# Patient Record
Sex: Male | Born: 1980 | Race: White | Hispanic: No | Marital: Married | Smoking: Never smoker
Health system: Southern US, Community
[De-identification: ages and names within clinical notes are randomized; demographics above are authoritative.]

## PROBLEM LIST (undated history)

## (undated) HISTORY — PX: OTHER SURGICAL HISTORY: SHX169

## (undated) HISTORY — PX: ARTHROSCOPY KNEE W/ DRILLING: SUR92

## (undated) HISTORY — DX: Morbid (severe) obesity due to excess calories: E66.01

## (undated) HISTORY — PX: WISDOM TOOTH EXTRACTION: SHX21

---

## 2008-11-23 ENCOUNTER — Encounter: Admission: RE | Admit: 2008-11-23 | Discharge: 2008-11-23 | Payer: Self-pay | Admitting: Internal Medicine

## 2009-12-23 IMAGING — CT CT ANGIO CHEST
4 of 9 series · 18 of 36 positions shown · IV contrast (CONTRAST)
Comparison: None

CLINICAL DATA: SHORTNESS OF BREATH, DECREASED OXYGENATION,
ASSESSMENT FOR PULMONARY EMBOLISM.

CT ANGIOGRAPHY CHEST WITH CONTRAST
TECHNIQUE: Multidetector CT imaging of the chest was performed
using the standard protocol during bolus administration of
intravenous contrast. Multiplanar CT image reconstructions
including MIPs were obtained to evaluate the vascular anatomy.
Contrast: Intravenous 150 ml [id number].

[Series 4: arterial 2x2 · axial · arterial · 0.90mm/px · z∈[+1322,+1522]mm · 5 of 121 slices shown]
[im 21/121  lung]
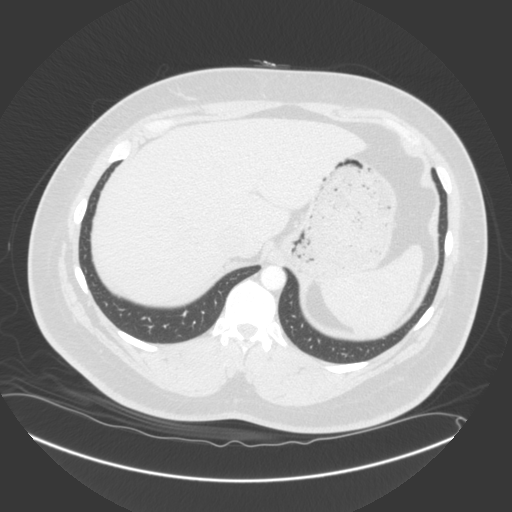
[im 41/121  mediastinal]
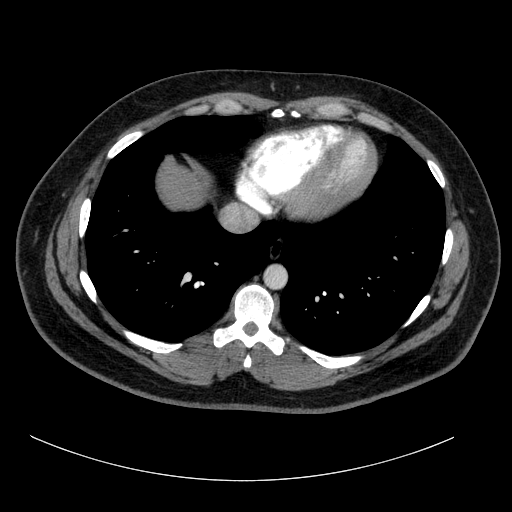
[im 61/121  lung]
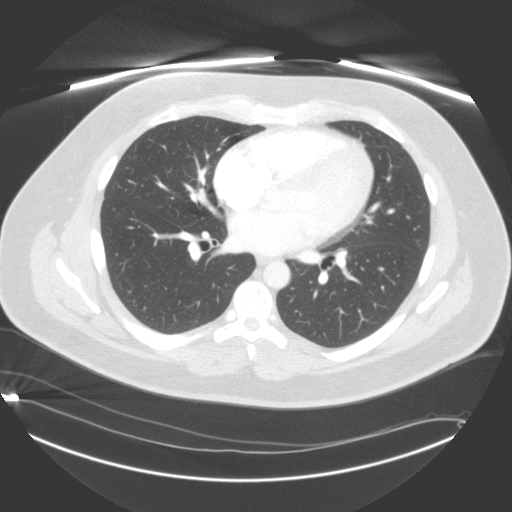
[im 81/121  mediastinal]
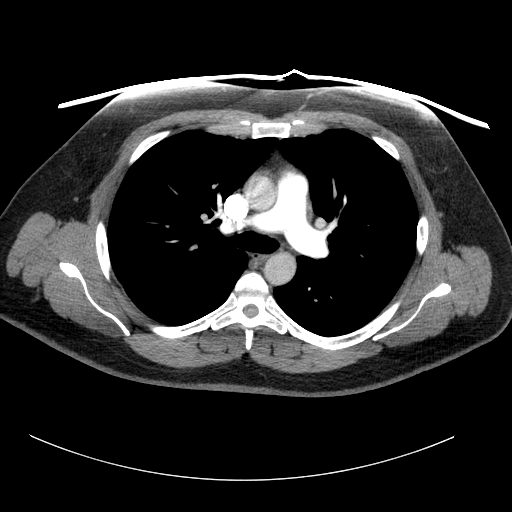
[im 101/121  lung]
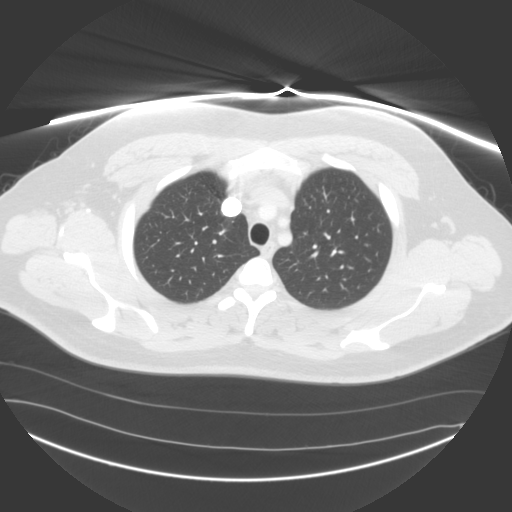

[Series 6: thins pacs · axial · 0.90mm/px · z∈[+1296,+1546]mm · 8 of 215 slices shown]
[im 18/215  lung]
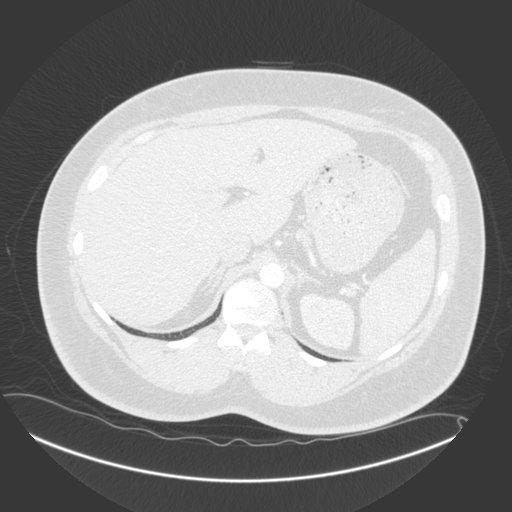
[im 54/215  lung]
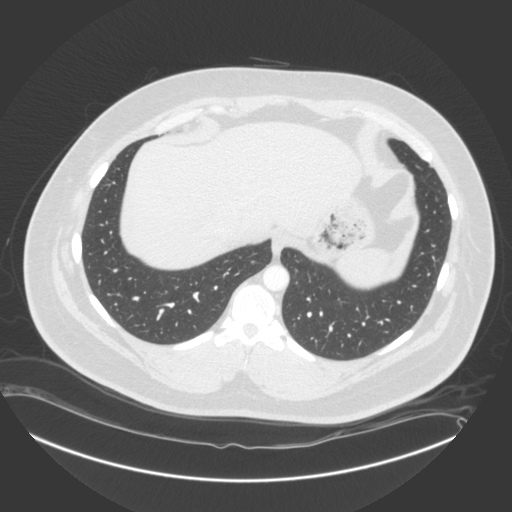
[im 72/215  lung]
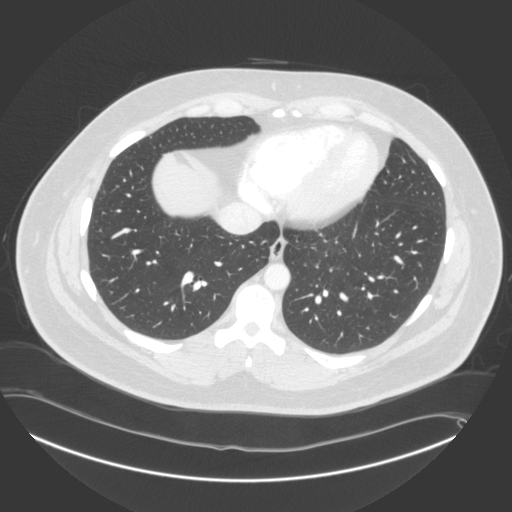
[im 90/215  lung]
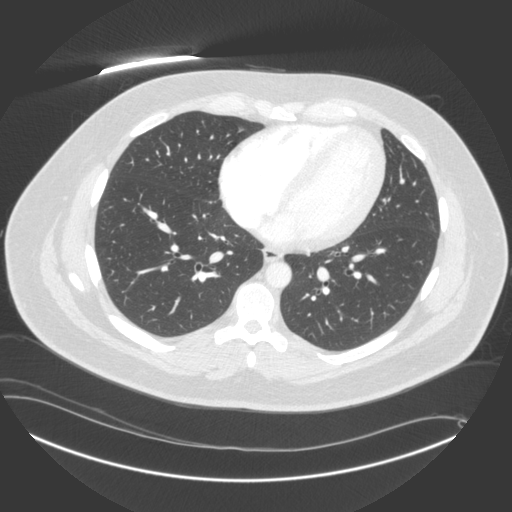
[im 125/215  lung]
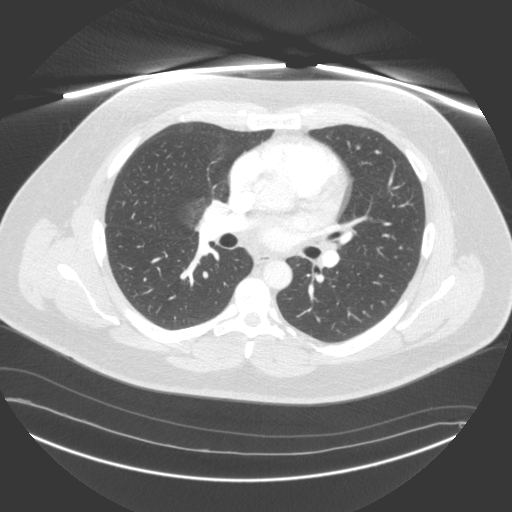
[im 143/215  lung]
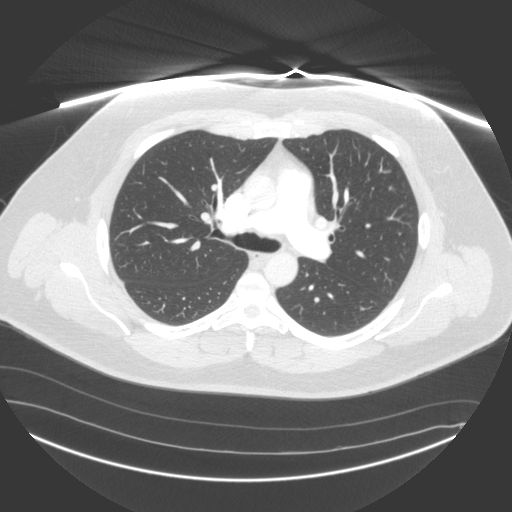
[im 161/215  lung]
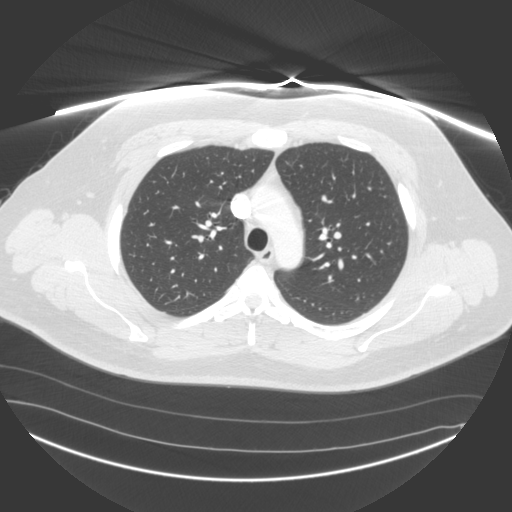
[im 197/215  lung]
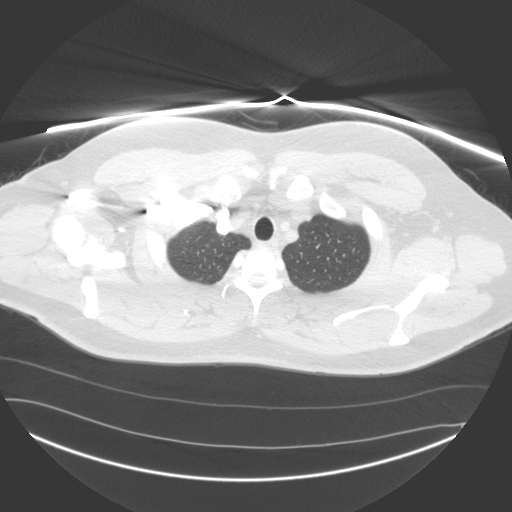

[Series 7: lung 3's · axial · 0.90mm/px · z∈[+1341,+1512]mm · 4 of 97 slices shown]
[im 20/97  mediastinal]
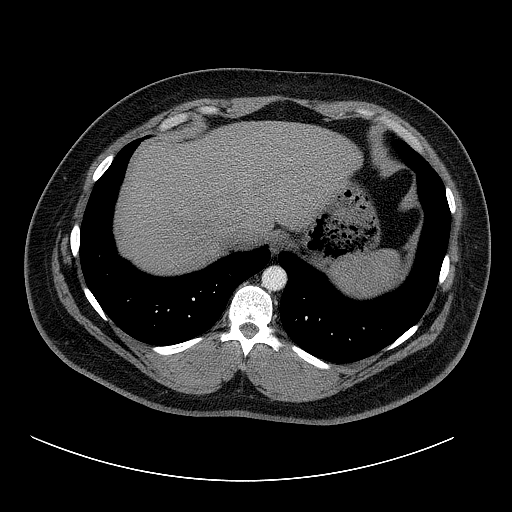
[im 39/97  mediastinal]
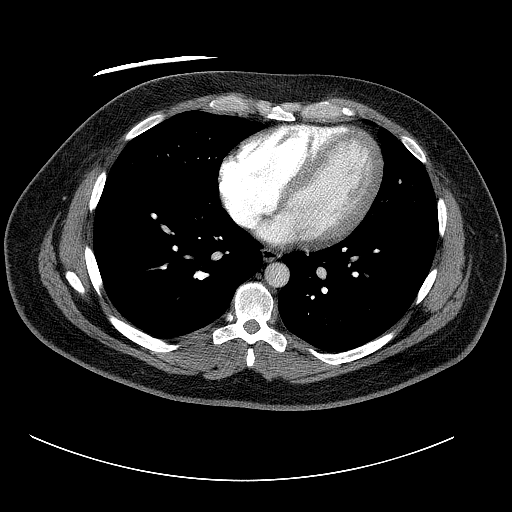
[im 58/97  mediastinal]
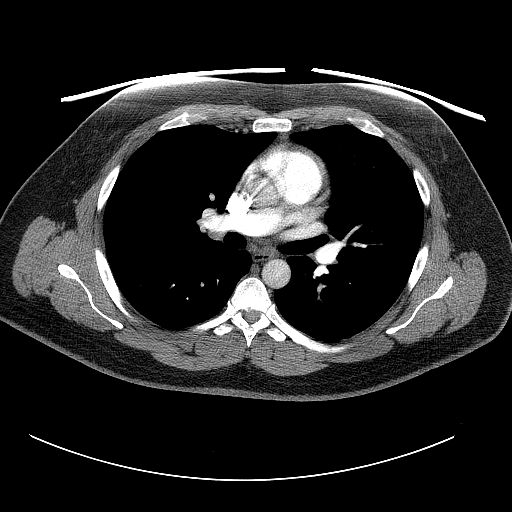
[im 77/97  mediastinal]
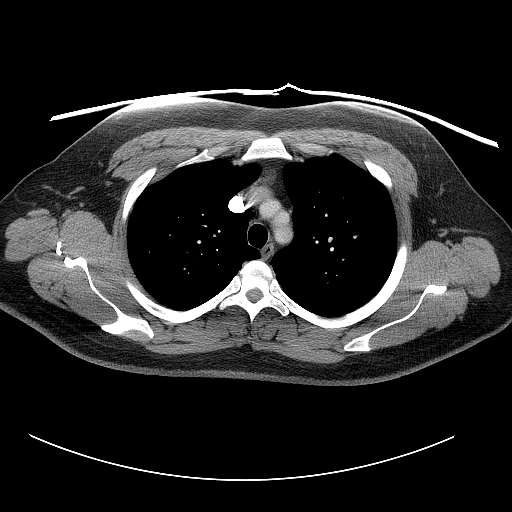

[Series 8054: coronals · coronal · 0.90mm/px · 1 of 106 slices shown]
[im 53/106  mediastinal]
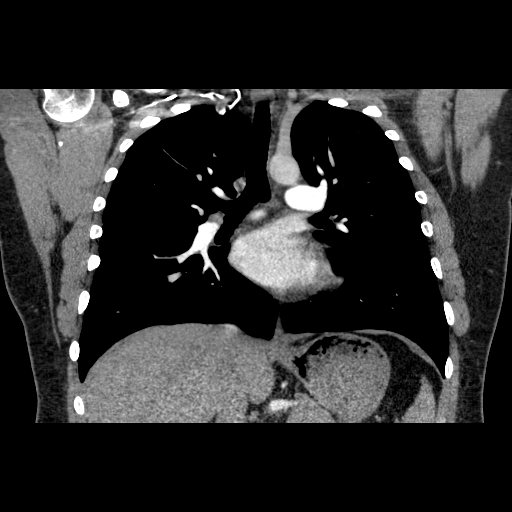

[18 of 36 positions shown; findings below may reference images not displayed]

FINDINGS: Lungs are clear.  No evidence for pulmonary embolus
seen.  Slight residual thymus tissue without focal mass is
consistent with the patient's age.  Heart size is normal.  No
mediastinal, hilar, axillary, supraclavicular mass/adenopathy is
seen.  No significant osseous lesions seen.  Upper abdominal organs
appear normal without inflammation, free fluid or adenopathy.

Review of the MIP images confirms the above findings.
IMPRESSION: 1.  No evidence for pulmonary embolus.
2.  Slight residual thymic tissue consistent with the patient's
age.
3.  Otherwise, negative.

## 2011-11-05 ENCOUNTER — Ambulatory Visit: Payer: Managed Care, Other (non HMO) | Admitting: Physician Assistant

## 2011-11-05 VITALS — BP 125/87 | HR 85 | Temp 98.7°F | Resp 16 | Ht 70.5 in | Wt 300.0 lb

## 2011-11-05 DIAGNOSIS — L255 Unspecified contact dermatitis due to plants, except food: Secondary | ICD-10-CM

## 2011-11-05 DIAGNOSIS — S8010XA Contusion of unspecified lower leg, initial encounter: Secondary | ICD-10-CM

## 2011-11-05 DIAGNOSIS — L0390 Cellulitis, unspecified: Secondary | ICD-10-CM

## 2011-11-05 DIAGNOSIS — L237 Allergic contact dermatitis due to plants, except food: Secondary | ICD-10-CM

## 2011-11-05 DIAGNOSIS — M79609 Pain in unspecified limb: Secondary | ICD-10-CM

## 2011-11-05 LAB — POCT CBC
HCT, POC: 39.7 % — AB (ref 43.5–53.7)
Hemoglobin: 14.7 g/dL (ref 14.1–18.1)
Lymph, poc: 2.2 (ref 0.6–3.4)
MCH, POC: 33.3 pg — AB (ref 27–31.2)
MCV: 90.1 fL (ref 80–97)
MPV: 8.7 fL (ref 0–99.8)
RBC: 4.41 M/uL — AB (ref 4.69–6.13)
WBC: 8.6 10*3/uL (ref 4.6–10.2)

## 2011-11-05 MED ORDER — METHYLPREDNISOLONE ACETATE 80 MG/ML IJ SUSP
80.0000 mg | Freq: Once | INTRAMUSCULAR | Status: AC
  Administered 2011-11-05: 80 mg via INTRAMUSCULAR

## 2011-11-05 MED ORDER — CEFTRIAXONE SODIUM 1 G IJ SOLR
1.0000 g | INTRAMUSCULAR | Status: DC
  Administered 2011-11-05: 1 g via INTRAMUSCULAR

## 2011-11-05 MED ORDER — PREDNISONE 20 MG PO TABS: ORAL_TABLET | ORAL | Status: AC

## 2011-11-05 MED ORDER — CEFTRIAXONE SODIUM 1 G IJ SOLR: 1.0000 g | Freq: Once | INTRAMUSCULAR | Status: DC

## 2011-11-05 MED ORDER — CEPHALEXIN 500 MG PO CAPS: 500.0000 mg | ORAL_CAPSULE | Freq: Three times a day (TID) | ORAL | Status: AC

## 2011-11-05 NOTE — Progress Notes (Signed)
Patient ID: Carlos Boyd MRN: [id number], DOB: [birth year], 31 y.o. Date of Encounter: 11/05/2011, 8:01 PM  Primary Physician: No primary provider on file.  Chief Complaint: Pruritic rash  HPI: 31 y.o. year old male with presents with a 7 day history of worseing erythematous pruritic rash along bilateral lower extremities. He states when he awoke this morning the erythema had slightly worsened causing his legs to feel tight. Multiple weeping lesions along lower legs. Still somewhat pruritic. Full range of motion and sensation along bilateral feet. Patient was doing yard work prior to the development of the rash. Known poison ivy in the vicinity. Did not wash all clothing or linens after the exposure. Has not yet washed the dog either. Lesions now weeping clear fluid. Has tried Benadryl, Motrin, and cortisone cream. Through out his illness he has remained afebrile and without chills. No nausea or vomiting. Patient is otherwise doing well without issues or complaints.  No past medical history on file.   Home Meds: Prior to Admission medications   Medication Sig Start Date End Date Taking? Authorizing Provider                  Allergies: No Known Allergies  History   Social History  . Marital Status: Unknown    Spouse Name: N/A    Number of Children: N/A  . Years of Education: N/A   Occupational History  . Not on file.   Social History Main Topics  . Smoking status: Never Smoker   . Smokeless tobacco: Not on file  . Alcohol Use: Not on file  . Drug Use: Not on file  . Sexually Active: Not on file   Other Topics Concern  . Not on file   Social History Narrative  . No narrative on file     Review of Systems: Constitutional: negative for chills, fever, night sweats, weight changes, or fatigue  HEENT: negative for vision changes, hearing loss, congestion, rhinorrhea, ST, epistaxis, or sinus pressure Cardiovascular: negative for chest pain or palpitations Respiratory:  negative for hemoptysis, wheezing, shortness of breath, or cough Abdominal: negative for abdominal pain, nausea, vomiting, diarrhea, or constipation Dermatological: see above Neurologic: negative for headache, dizziness, or syncope   Physical Exam: Blood pressure 125/87, pulse 85, temperature 98.7 F (37.1 C), resp. rate 16, height 5' 10.5" (1.791 m), weight 300 lb (136.079 kg)., Body mass index is 42.44 kg/(m^2). General: Well developed, well nourished, in no acute distress. Head: Normocephalic, atraumatic, eyes without discharge, sclera non-icteric, nares are without discharge.  Neck: Supple. No thyromegaly. Full ROM. No lymphadenopathy. Lungs: Breathing is unlabored. Heart: Regula rate. Msk:  Strength and tone normal for age. Extremities/Skin: See photo scanned in. Multiple vesicular weeping lesions along bilateral lower extremities, right greater than left, consistent with poison ivy. He does have a secondary cellulitis along the right leg. DP/PT pulses 2+ and equal bilaterally. Cap refill less than 2 seconds through all digits. No clubbing or cyanosis.  Neuro: Alert and oriented X 3. Moves all extremities spontaneously. Gait is normal. CNII-XII grossly in tact. Psych:  Responds to questions appropriately with a normal affect.   Labs: Results for orders placed in visit on 11/05/11  POCT CBC      Component Value Range   WBC 8.6  4.6 - 10.2 (K/uL)   Lymph, poc 2.2  0.6 - 3.4    POC LYMPH PERCENT 25.9  10 - 50 (%L)   MID (cbc) 0.7  0 - 0.9    POC  MID % 8.5  0 - 12 (%M)   POC Granulocyte 5.6  2 - 6.9    Granulocyte percent 65.6  37 - 80 (%G)   RBC 4.41 (*) 4.69 - 6.13 (M/uL)   Hemoglobin 14.7  14.1 - 18.1 (g/dL)   HCT, POC 91.4 (*) 78.2 - 53.7 (%)   MCV 90.1  80 - 97 (fL)   MCH, POC 33.3 (*) 27 - 31.2 (pg)   MCHC 37.0 (*) 31.8 - 35.4 (g/dL)   RDW, POC 95.6     Platelet Count, POC 292  142 - 424 (K/uL)   MPV 8.7  0 - 99.8 (fL)  GLUCOSE, POCT (MANUAL RESULT ENTRY)      Component  Value Range   POC Glucose 93       ASSESSMENT AND PLAN:  31 y.o. year old male with poison ivy bilateral extremities with cellulitis -Wound dressed with telfa and ACE wrap -Elevation -DepoMedrol 80 mg IM -Rocephin 1 gram IM -Prednisone 20 mg #18 3x3, 2x3, 1x3 no RF -Zyrtec -Zantac -Benadryl -Wash all contaminated clothes and linens -Recheck 3 days, sooner if worse   Signed, Eula Listen, PA-C 11/05/2011 8:01 PM

## 2011-11-08 ENCOUNTER — Ambulatory Visit: Payer: Managed Care, Other (non HMO) | Admitting: Family Medicine

## 2011-11-08 VITALS — BP 134/88 | HR 76 | Temp 98.6°F | Resp 18 | Ht 70.75 in | Wt 301.8 lb

## 2011-11-08 DIAGNOSIS — L0291 Cutaneous abscess, unspecified: Secondary | ICD-10-CM

## 2011-11-08 DIAGNOSIS — L237 Allergic contact dermatitis due to plants, except food: Secondary | ICD-10-CM

## 2011-11-08 DIAGNOSIS — L0390 Cellulitis, unspecified: Secondary | ICD-10-CM

## 2011-11-08 DIAGNOSIS — L255 Unspecified contact dermatitis due to plants, except food: Secondary | ICD-10-CM

## 2011-11-08 NOTE — Progress Notes (Signed)
[facility]:  Office Visit  Chief Complaint:  Chief Complaint  Patient presents with  . Follow-up    cellulitis right leg    HPI: Carlos Boyd is a 31 y.o. male who complains of  Recheck for posion ivy and cellulitis on bilateral Yader Criger . Much improved.   History reviewed. No pertinent past medical history. Past Surgical History  Procedure Date  . Right arthroscopy knee     [place], [place]   History   Social History  . Marital Status: Unknown    Spouse Name: N/A    Number of Children: N/A  . Years of Education: N/A   Social History Main Topics  . Smoking status: Never Smoker   . Smokeless tobacco: None  . Alcohol Use: Yes  . Drug Use: No  . Sexually Active: None   Other Topics Concern  . None   Social History Narrative  . None   No family history on file. No Known Allergies Prior to Admission medications   Medication Sig Start Date End Date Taking? Authorizing Provider  cephALEXin (KEFLEX) 500 MG capsule Take 1 capsule (500 mg total) by mouth 3 (three) times daily. 11/05/11 11/15/11 Yes Ryan M Dunn, PA-C  predniSONE (DELTASONE) 20 MG tablet 3 PO FOR 3 DAYS, 2 PO FOR 3 DAYS, 1 PO FOR 3 DAYS 11/05/11 11/15/11 Yes Ryan M Dunn, PA-C     ROS: The patient denies fevers, chills, night sweats, unintentional weight loss, chest pain, palpitations, wheezing, dyspnea on exertion, nausea, vomiting, abdominal pain, dysuria, hematuria, melena, numbness, weakness, or tingling. + rash  All other systems have been reviewed and were otherwise negative with the exception of those mentioned in the HPI and as above.    PHYSICAL EXAM: Filed Vitals:   11/08/11 0758  BP: 134/88  Pulse: 76  Temp: 98.6 F (37 C)  Resp: 18   Filed Vitals:   11/08/11 0758  Height: 5' 10.75" (1.797 m)  Weight: 301 lb 12.8 oz (136.896 kg)   Body mass index is 42.39 kg/(m^2).  General: Alert, no acute distress HEENT:  Normocephalic, atraumatic, oropharynx patent.  Cardiovascular:   Regular rate and rhythm, no rubs murmurs or gallops.  No Carotid bruits, radial pulse intact. No pedal edema.  Respiratory: Clear to auscultation bilaterally.  No wheezes, rales, or rhonchi.  No cyanosis, no use of accessory musculature GI: No organomegaly, abdomen is soft and non-tender, positive bowel sounds.  No masses. Skin: + excoriation, + redness, no warmth, + drying blisters bilateral lower extremities. Redness up to calf ( per patient has not expanded)  Neurologic: Facial musculature symmetric. Psychiatric: Patient is appropriate throughout our interaction. Lymphatic: No cervical lymphadenopathy Musculoskeletal: Gait intact.   LABS: Results for orders placed in visit on 11/05/11  POCT CBC      Component Value Range   WBC 8.6  4.6 - 10.2 (K/uL)   Lymph, poc 2.2  0.6 - 3.4    POC LYMPH PERCENT 25.9  10 - 50 (%L)   MID (cbc) 0.7  0 - 0.9    POC MID % 8.5  0 - 12 (%M)   POC Granulocyte 5.6  2 - 6.9    Granulocyte percent 65.6  37 - 80 (%G)   RBC 4.41 (*) 4.69 - 6.13 (M/uL)   Hemoglobin 14.7  14.1 - 18.1 (g/dL)   HCT, POC 16.1 (*) 09.6 - 53.7 (%)   MCV 90.1  80 - 97 (fL)   MCH, POC 33.3 (*) 27 -  31.2 (pg)   MCHC 37.0 (*) 31.8 - 35.4 (g/dL)   RDW, POC 04.5     Platelet Count, POC 292  142 - 424 (K/uL)   MPV 8.7  0 - 99.8 (fL)  GLUCOSE, POCT (MANUAL RESULT ENTRY)      Component Value Range   POC Glucose 93       EKG/XRAY:   Primary read interpreted by Dr. Conley Rolls at [facility]   ASSESSMENT/PLAN: Encounter Diagnoses  Name Primary?  . Poison oak Yes  . Cellulitis     Improving poison ivy with superimposed cellulitis Continue with prednisone, benadryl and antibiotics. F/u prn   Avery Eustice PHUONG, DO 11/08/2011 8:16 AM

## 2013-12-25 ENCOUNTER — Encounter (HOSPITAL_COMMUNITY): Payer: Self-pay | Admitting: Emergency Medicine

## 2013-12-25 ENCOUNTER — Emergency Department (HOSPITAL_COMMUNITY)
Admission: EM | Admit: 2013-12-25 | Discharge: 2013-12-25 | Disposition: A | Discharge status: Home / Self Care | Payer: Managed Care, Other (non HMO) | Source: Home / Self Care

## 2013-12-25 DIAGNOSIS — S90869A Insect bite (nonvenomous), unspecified foot, initial encounter: Secondary | ICD-10-CM

## 2013-12-25 DIAGNOSIS — IMO0002 Reserved for concepts with insufficient information to code with codable children: Secondary | ICD-10-CM

## 2013-12-25 DIAGNOSIS — W57XXXA Bitten or stung by nonvenomous insect and other nonvenomous arthropods, initial encounter: Principal | ICD-10-CM

## 2013-12-25 NOTE — Discharge Instructions (Signed)
Insect Bite Mosquitoes, flies, fleas, bedbugs, and many other insects can bite. Insect bites are different from insect stings. A sting is when venom is injected into the skin. Some insect bites can transmit infectious diseases. SYMPTOMS  Insect bites usually turn red, swell, and itch for 2 to 4 days. They often go away on their own. TREATMENT  Your caregiver may prescribe antibiotic medicines if a bacterial infection develops in the bite. HOME CARE INSTRUCTIONS  Do not scratch the bite area.  Keep the bite area clean and dry. Wash the bite area thoroughly with soap and water.  Put ice or cool compresses on the bite area.  Put ice in a plastic bag.  Place a towel between your skin and the bag.  Leave the ice on for 20 minutes, 4 times a day for the first 2 to 3 days, or as directed.  You may apply a baking soda paste, cortisone cream, or calamine lotion to the bite area as directed by your caregiver. This can help reduce itching and swelling.  Only take over-the-counter or prescription medicines as directed by your caregiver.  If you are given antibiotics, take them as directed. Finish them even if you start to feel better. You may need a tetanus shot if:  You cannot remember when you had your last tetanus shot.  You have never had a tetanus shot.  The injury broke your skin. If you get a tetanus shot, your arm may swell, get red, and feel warm to the touch. This is common and not a problem. If you need a tetanus shot and you choose not to have one, there is a rare chance of getting tetanus. Sickness from tetanus can be serious. SEEK IMMEDIATE MEDICAL CARE IF:   You have increased pain, redness, or swelling in the bite area.  You see a red line on the skin coming from the bite.  You have a fever.  You have joint pain.  You have a headache or neck pain.  You have unusual weakness.  You have a rash.  You have chest pain or shortness of breath.  You have abdominal pain,  nausea, or vomiting.  You feel unusually tired or sleepy. MAKE SURE YOU:   Understand these instructions.  Will watch your condition.  Will get help right away if you are not doing well or get worse. Document Released: 08/16/2004 Document Revised: 10/01/2011 Document Reviewed: 02/07/2011 [facility] Patient Information 2014 [place], [place].  Tick Bite Information Ticks are insects that attach themselves to the skin and draw blood for food. There are various types of ticks. Common types include wood ticks and deer ticks. Most ticks live in shrubs and grassy areas. Ticks can climb onto your body when you make contact with leaves or grass where the tick is waiting. The most common places on the body for ticks to attach themselves are the scalp, neck, armpits, waist, and groin. Most tick bites are harmless, but sometimes ticks carry germs that cause diseases. These germs can be spread to a person during the tick's feeding process. The chance of a disease spreading through a tick bite depends on:   The type of tick.  Time of year.   How long the tick is attached.   Geographic location.  HOW CAN YOU PREVENT TICK BITES? Take these steps to help prevent tick bites when you are outdoors:  Wear protective clothing. Long sleeves and long pants are best.   Wear white clothes so you can see ticks  more easily.  Tuck your pant legs into your socks.   If walking on a trail, stay in the middle of the trail to avoid brushing against bushes.  Avoid walking through areas with long grass.  Put insect repellent on all exposed skin and along boot tops, pant legs, and sleeve cuffs.   Check clothing, hair, and skin repeatedly and before going inside.   Brush off any ticks that are not attached.  Take a shower or bath as soon as possible after being outdoors.  WHAT IS THE PROPER WAY TO REMOVE A TICK? Ticks should be removed as soon as possible to help prevent diseases caused by tick  bites. 1. If latex gloves are available, put them on before trying to remove a tick.  2. Using fine-point tweezers, grasp the tick as close to the skin as possible. You may also use curved forceps or a tick removal tool. Grasp the tick as close to its head as possible. Avoid grasping the tick on its body. 3. Pull gently with steady upward pressure until the tick lets go. Do not twist the tick or jerk it suddenly. This may break off the tick's head or mouth parts. 4. Do not squeeze or crush the tick's body. This could force disease-carrying fluids from the tick into your body.  5. After the tick is removed, wash the bite area and your hands with soap and water or other disinfectant such as alcohol. 6. Apply a small amount of antiseptic cream or ointment to the bite site.  7. Wash and disinfect any instruments that were used.  Do not try to remove a tick by applying a hot match, petroleum jelly, or fingernail polish to the tick. These methods do not work and may increase the chances of disease being spread from the tick bite.  WHEN SHOULD YOU SEEK MEDICAL CARE? Contact your health care provider if you are unable to remove a tick from your skin or if a part of the tick breaks off and is stuck in the skin.  After a tick bite, you need to be aware of signs and symptoms that could be related to diseases spread by ticks. Contact your health care provider if you develop any of the following in the days or weeks after the tick bite:  Unexplained fever.  Rash. A circular rash that appears days or weeks after the tick bite may indicate the possibility of Lyme disease. The rash may resemble a target with a bull's-eye and may occur at a different part of your body than the tick bite.  Redness and swelling in the area of the tick bite.   Tender, swollen lymph glands.   Diarrhea.   Weight loss.   Cough.   Fatigue.   Muscle, joint, or bone pain.   Abdominal pain.   Headache.    Lethargy or a change in your level of consciousness.  Difficulty walking or moving your legs.   Numbness in the legs.   Paralysis.  Shortness of breath.   Confusion.   Repeated vomiting.  Document Released: 07/06/2000 Document Revised: 04/29/2013 Document Reviewed: 12/17/2012 [facility] Patient Information 2014 [place].  Lyme Disease You may have been bitten by a tick and are to watch for the development of Lyme Disease. Lyme Disease is an infection that is caused by a bacteria The bacteria causing this disease is named Borreilia burgdorferi. If a tick is infected with this bacteria and then bites you, then Lyme Disease may occur. These ticks  are carried by deer and rodents such as rabbits and mice and infest grassy as well as forested areas. Fortunately most tick bites do not cause Lyme Disease.  Lyme Disease is easier to prevent than to treat. First, covering your legs with clothing when walking in areas where ticks are possibly abundant will prevent their attachment because ticks tend to stay within inches of the ground. Second, using insecticides containing DEET can be applied on skin or clothing. Last, because it takes about 12 to 24 hours for the tick to transmit the disease after attachment to the human host, you should inspect your body for ticks twice a day when you are in areas where Lyme Disease is common. You must look thoroughly when searching for ticks. The Ixodes tick that carries Lyme Disease is very small. It is around the size of a sesame seed (picture of tick is not actual size). Removal is best done by grasping the tick by the head and pulling it out. Do not to squeeze the body of the tick. This could inject the infecting bacteria into the bite site. Wash the area of the bite with an antiseptic solution after removal.  Lyme Disease is a disease that may affect many body systems. Because of the small size of the biting tick, most people do not notice being  bitten. The first sign of an infection is usually a round red rash that extends out from the center of the tick bite. The center of the lesion may be blood colored (hemorrhagic) or have tiny blisters (vesicular). Most lesions have bright red outer borders and partial central clearing. This rash may extend out many inches in diameter, and multiple lesions may be present. Other symptoms such as fatigue, headaches, chills and fever, general achiness and swelling of lymph glands may also occur. If this first stage of the disease is left untreated, these symptoms may gradually resolve by themselves, or progressive symptoms may occur because of spread of infection to other areas of the body.  Follow up with your caregiver to have testing and treatment if you have a tick bite and you develop any of the above complaints. Your caregiver may recommend preventative (prophylactic) medications which kill bacteria (antibiotics). Once a diagnosis of Lyme Disease is made, antibiotic treatment is highly likely to cure the disease. Effective treatment of late stage Lyme Disease may require longer courses of antibiotic therapy.  MAKE SURE YOU:   Understand these instructions.  Will watch your condition.  Will get help right away if you are not doing well or get worse. Document Released: 10/15/2000 Document Revised: 10/01/2011 Document Reviewed: 12/17/2008 [facility] Patient Information 2014 [place], [place].

## 2013-12-25 NOTE — ED Notes (Signed)
Reports tick to right foot, noticed tick 2 days ago.  Reports redness

## 2013-12-25 NOTE — ED Provider Notes (Signed)
CSN: [id number]     Arrival date & time 12/25/13  0803 History   First MD Initiated Contact with Patient 12/25/13 0825     No chief complaint on file.  (Consider location/radiation/quality/duration/timing/severity/associated sxs/prior Treatment) HPI Comments: Pulled a tiny tick off of right foot between the 3rd and 4th toes about 10-11 d ago. 2-3 d ago noticed a small area of redness in the same area that itches. Denies systemic sx's. No local pain or tenderness. St feels well.   No past medical history on file. Past Surgical History  Procedure Laterality Date  . Right arthroscopy knee      [place], [place]   No family history on file. History  Substance Use Topics  . Smoking status: Never Smoker   . Smokeless tobacco: Not on file  . Alcohol Use: Yes    Review of Systems  Constitutional: Negative.  Negative for fever, chills, diaphoresis, activity change and fatigue.  HENT: Negative.   Respiratory: Negative.   Gastrointestinal: Negative.   Skin: Positive for color change.  Neurological: Negative.     Allergies  Review of patient's allergies indicates no known allergies.  Home Medications   Prior to Admission medications   Not on File   BP 140/99  Pulse 84  Temp(Src) 98.4 F (36.9 C) (Oral)  Resp 18  SpO2 97% Physical Exam  Nursing note and vitals reviewed. Constitutional: He is oriented to person, place, and time. He appears well-developed and well-nourished. No distress.  Neck: Normal range of motion. Neck supple.  Cardiovascular: Regular rhythm.   Pulmonary/Chest: Effort normal. No respiratory distress.  Musculoskeletal: He exhibits no edema and no tenderness.  Neurological: He is alert and oriented to person, place, and time.  Skin: Skin is warm and dry. No rash noted. He is not diaphoretic.  There is a 40mm x 4 mm area of light cutaneous erythema at the site of the tick bite. Blanches. Nontender. No signs of infection, drainage, induration, bleeding,  lymphangitis.  Psychiatric: He has a normal mood and affect.    ED Course  Procedures (including critical care time) Labs Review Labs Reviewed - No data to display  Imaging Review No results found.   MDM   1. Tick bite of foot     No signs of infection locally or systemically . This very small area of cutaneous erythema itches, not tender. No rash. Likely a localized immune reaction from the bite. Benadryl cream and cortaid to area of redness on toe. For signs of illness or worsening return promptly.    Hayden Rasmussen, NP 12/25/13 [id number]

## 2013-12-25 NOTE — ED Provider Notes (Signed)
Medical screening examination/treatment/procedure(s) were performed by resident physician or non-physician practitioner and as supervising physician I was immediately available for consultation/collaboration.   KINDL,JAMES DOUGLAS MD.   James D Kindl, MD 12/25/13 1150 

## 2014-11-04 DIAGNOSIS — S46002A Unspecified injury of muscle(s) and tendon(s) of the rotator cuff of left shoulder, initial encounter: Secondary | ICD-10-CM | POA: Insufficient documentation

## 2014-11-04 DIAGNOSIS — R030 Elevated blood-pressure reading, without diagnosis of hypertension: Secondary | ICD-10-CM | POA: Insufficient documentation

## 2014-11-29 ENCOUNTER — Encounter (INDEPENDENT_AMBULATORY_CARE_PROVIDER_SITE_OTHER): Payer: Self-pay

## 2014-11-29 ENCOUNTER — Encounter: Payer: Self-pay | Admitting: Family

## 2014-11-29 ENCOUNTER — Ambulatory Visit (INDEPENDENT_AMBULATORY_CARE_PROVIDER_SITE_OTHER): Payer: Managed Care, Other (non HMO) | Admitting: Family

## 2014-11-29 ENCOUNTER — Other Ambulatory Visit (INDEPENDENT_AMBULATORY_CARE_PROVIDER_SITE_OTHER): Payer: Managed Care, Other (non HMO)

## 2014-11-29 VITALS — BP 124/90 | HR 69 | Temp 97.6°F | Ht 70.5 in | Wt 307.5 lb

## 2014-11-29 DIAGNOSIS — Z23 Encounter for immunization: Secondary | ICD-10-CM

## 2014-11-29 DIAGNOSIS — Z0000 Encounter for general adult medical examination without abnormal findings: Secondary | ICD-10-CM | POA: Diagnosis not present

## 2014-11-29 DIAGNOSIS — R21 Rash and other nonspecific skin eruption: Secondary | ICD-10-CM | POA: Insufficient documentation

## 2014-11-29 LAB — BASIC METABOLIC PANEL
BUN: 15 mg/dL (ref 6–23)
CALCIUM: 9.5 mg/dL (ref 8.4–10.5)
CO2: 27 mEq/L (ref 19–32)
CREATININE: 0.84 mg/dL (ref 0.40–1.50)
Chloride: 106 mEq/L (ref 96–112)
GFR: 110.14 mL/min (ref >60.00)
Glucose, Bld: 91 mg/dL (ref 70–99)
Potassium: 4.8 mEq/L (ref 3.5–5.1)
SODIUM: 139 meq/L (ref 135–145)

## 2014-11-29 LAB — CBC
HCT: 42.9 % (ref 39.0–52.0)
HEMOGLOBIN: 14.9 g/dL (ref 13.0–17.0)
MCHC: 34.7 g/dL (ref 30.0–36.0)
MCV: 86.2 fl (ref 78.0–100.0)
PLATELETS: 280 10*3/uL (ref 150.0–400.0)
RBC: 4.98 Mil/uL (ref 4.22–5.81)
RDW: 13.3 % (ref 11.5–15.5)
WBC: 7.4 10*3/uL (ref 4.0–10.5)

## 2014-11-29 LAB — TSH: TSH: 1.69 u[IU]/mL (ref 0.35–4.50)

## 2014-11-29 LAB — LIPID PANEL
CHOL/HDL RATIO: 3
CHOLESTEROL: 166 mg/dL (ref 0–200)
HDL: 51.3 mg/dL (ref >39.00)
LDL Cholesterol: 103 mg/dL — ABNORMAL HIGH (ref 0–99)
NonHDL: 114.7
TRIGLYCERIDES: 58 mg/dL (ref 0.0–149.0)
VLDL: 11.6 mg/dL (ref 0.0–40.0)

## 2014-11-29 NOTE — Progress Notes (Signed)
Pre visit review using our clinic review tool, if applicable. No additional management support is needed unless otherwise documented below in the visit note. 

## 2014-11-29 NOTE — Patient Instructions (Signed)
Thank you for choosing [facility].  Summary/Instructions:  Please stop by the lab on the basement level of the building for your blood work. Your results will be released to MyChart (or called to you) after review, usually within 72 hours after test completion. If any changes need to be made, you will be notified at that same time.  If your symptoms worsen or fail to improve, please contact our office for further instruction, or in case of emergency go directly to the emergency room at the closest medical facility.   Health Maintenance A healthy lifestyle and preventative care can promote health and wellness.  Maintain regular health, dental, and eye exams.  Eat a healthy diet. Foods like vegetables, fruits, whole grains, low-fat dairy products, and lean protein foods contain the nutrients you need and are low in calories. Decrease your intake of foods high in solid fats, added sugars, and salt. Get information about a proper diet from your health care provider, if necessary.  Regular physical exercise is one of the most important things you can do for your health. Most adults should get at least 150 minutes of moderate-intensity exercise (any activity that increases your heart rate and causes you to sweat) each week. In addition, most adults need muscle-strengthening exercises on 2 or more days a week.   Maintain a healthy weight. The body mass index (BMI) is a screening tool to identify possible weight problems. It provides an estimate of body fat based on height and weight. Your health care provider can find your BMI and can help you achieve or maintain a healthy weight. For males 20 years and older:  A BMI below 18.5 is considered underweight.  A BMI of 18.5 to 24.9 is normal.  A BMI of 25 to 29.9 is considered overweight.  A BMI of 30 and above is considered obese.  Maintain normal blood lipids and cholesterol by exercising and minimizing your intake of saturated fat. Eat a  balanced diet with plenty of fruits and vegetables. Blood tests for lipids and cholesterol should begin at age 20 and be repeated every 5 years. If your lipid or cholesterol levels are high, you are over age 50, or you are at high risk for heart disease, you may need your cholesterol levels checked more frequently.Ongoing high lipid and cholesterol levels should be treated with medicines if diet and exercise are not working.  If you smoke, find out from your health care provider how to quit. If you do not use tobacco, do not start.  Lung cancer screening is recommended for adults aged 55-80 years who are at high risk for developing lung cancer because of a history of smoking. A yearly low-dose CT scan of the lungs is recommended for people who have at least a 30-pack-year history of smoking and are current smokers or have quit within the past 15 years. A pack year of smoking is smoking an average of 1 pack of cigarettes a day for 1 year (for example, a 30-pack-year history of smoking could mean smoking 1 pack a day for 30 years or 2 packs a day for 15 years). Yearly screening should continue until the smoker has stopped smoking for at least 15 years. Yearly screening should be stopped for people who develop a health problem that would prevent them from having lung cancer treatment.  If you choose to drink alcohol, do not have more than 2 drinks per day. One drink is considered to be 12 oz (360 mL) of beer,   5 oz (150 mL) of wine, or 1.5 oz (45 mL) of liquor.  Avoid the use of street drugs. Do not share needles with anyone. Ask for help if you need support or instructions about stopping the use of drugs.  High blood pressure causes heart disease and increases the risk of stroke. Blood pressure should be checked at least every 1-2 years. Ongoing high blood pressure should be treated with medicines if weight loss and exercise are not effective.  If you are 45-79 years old, ask your health care provider if  you should take aspirin to prevent heart disease.  Diabetes screening involves taking a blood sample to check your fasting blood sugar level. This should be done once every 3 years after age 45 if you are at a normal weight and without risk factors for diabetes. Testing should be considered at a younger age or be carried out more frequently if you are overweight and have at least 1 risk factor for diabetes.  Colorectal cancer can be detected and often prevented. Most routine colorectal cancer screening begins at the age of 50 and continues through age 75. However, your health care provider may recommend screening at an earlier age if you have risk factors for colon cancer. On a yearly basis, your health care provider may provide home test kits to check for hidden blood in the stool. A small camera at the end of a tube may be used to directly examine the colon (sigmoidoscopy or colonoscopy) to detect the earliest forms of colorectal cancer. Talk to your health care provider about this at age 50 when routine screening begins. A direct exam of the colon should be repeated every 5-10 years through age 75, unless early forms of precancerous polyps or small growths are found.  People who are at an increased risk for hepatitis B should be screened for this virus. You are considered at high risk for hepatitis B if:  You were born in a country where hepatitis B occurs often. Talk with your health care provider about which countries are considered high risk.  Your parents were born in a high-risk country and you have not received a shot to protect against hepatitis B (hepatitis B vaccine).  You have HIV or AIDS.  You use needles to inject street drugs.  You live with, or have sex with, someone who has hepatitis B.  You are a man who has sex with other men (MSM).  You get hemodialysis treatment.  You take certain medicines for conditions like cancer, organ transplantation, and autoimmune  conditions.  Hepatitis C blood testing is recommended for all people born from 1945 through 1965 and any individual with known risk factors for hepatitis C.  Healthy men should no longer receive prostate-specific antigen (PSA) blood tests as part of routine cancer screening. Talk to your health care provider about prostate cancer screening.  Testicular cancer screening is not recommended for adolescents or adult males who have no symptoms. Screening includes self-exam, a health care provider exam, and other screening tests. Consult with your health care provider about any symptoms you have or any concerns you have about testicular cancer.  Practice safe sex. Use condoms and avoid high-risk sexual practices to reduce the spread of sexually transmitted infections (STIs).  You should be screened for STIs, including gonorrhea and chlamydia if:  You are sexually active and are younger than 24 years.  You are older than 24 years, and your health care provider tells you that you are at   risk for this type of infection.  Your sexual activity has changed since you were last screened, and you are at an increased risk for chlamydia or gonorrhea. Ask your health care provider if you are at risk.  If you are at risk of being infected with HIV, it is recommended that you take a prescription medicine daily to prevent HIV infection. This is called pre-exposure prophylaxis (PrEP). You are considered at risk if:  You are a man who has sex with other men (MSM).  You are a heterosexual man who is sexually active with multiple partners.  You take drugs by injection.  You are sexually active with a partner who has HIV.  Talk with your health care provider about whether you are at high risk of being infected with HIV. If you choose to begin PrEP, you should first be tested for HIV. You should then be tested every 3 months for as long as you are taking PrEP.  Use sunscreen. Apply sunscreen liberally and  repeatedly throughout the day. You should seek shade when your shadow is shorter than you. Protect yourself by wearing long sleeves, pants, a wide-brimmed hat, and sunglasses year round whenever you are outdoors.  Tell your health care provider of new moles or changes in moles, especially if there is a change in shape or color. Also, tell your health care provider if a mole is larger than the size of a pencil eraser.  A one-time screening for abdominal aortic aneurysm (AAA) and surgical repair of large AAAs by ultrasound is recommended for men aged 65-75 years who are current or former smokers.  Stay current with your vaccines (immunizations). Document Released: 01/05/2008 Document Revised: 07/14/2013 Document Reviewed: 12/04/2010 Exit[employer] Patient Information 2015 [employer]. This information is not intended to replace advice given to you by your health care provider. Make sure you discuss any questions you have with your health care provider.  

## 2014-11-29 NOTE — Assessment & Plan Note (Addendum)
1) Anticipatory Guidance: Discussed importance of wearing a seatbelt while driving and not texting while driving; changing batteries in smoke detector at least once annually; wearing suntan lotion when outside; eating a balanced and moderate diet; getting physical activity at least 30 minutes per day.  2) Immunizations / Screenings / Labs:  TDap given in office today. Other immunizations are up-to-date per recommendations. Due for a vision exam which he will schedule independently. Other screenings are up-to-date per recommendations. Obtain CBC, BMET, Lipid profile and TSH.   Overall well exam. Patient has cardiovascular risk factors including increased diastolic blood pressure and obesity. Blood pressure was noted to be elevated today at 124/90. Has previous elevated blood pressure readings from other providers. Schedule nurse's visit to recheck blood pressure. BMI of 43 indicates severe obesity. Discussed importance of nutrient dense foods and decreasing saturated fats and increasing physical activity to 30 minutes of moderate intensity exercise most days of the week. Although he walks at lunch, encouraged exercise for time as opposed to distance. Follow-up prevention exam in one year. Follow-up office visit pending lab work and nursing visit.

## 2014-11-29 NOTE — Progress Notes (Signed)
Subjective:    Patient ID: Carlos Boyd, male    DOB: 01/02/1981, 34 y.o.   MRN: [id number]  Chief Complaint  Patient presents with  . Establish Care  . Annual Exam    HPI:  Carlos Boyd is a 34 y.o. male who presents today for an annual wellness visit.   1) Health Maintenance -   Diet - Averages about 3 meals plus snacks consisting of fruits, dried fruits, nuts, primarily vegetarian, dairy  Exercise - started walking a 1 mile per day during lunch.   2) Preventative Exams / Immunizations:  Dental -- Up to date  Vision -- Due for exam.    Health Maintenance  Topic Date Due  . HIV Screening  04/24/1994  . TETANUS/TDAP  04/24/1998  . INFLUENZA VACCINE  02/21/2015    Immunization History  Administered Date(s) Administered  . Tdap 11/29/2014    No Known Allergies  No current outpatient prescriptions on file prior to visit.   No current facility-administered medications on file prior to visit.    History reviewed. No pertinent past medical history.  Past Surgical History  Procedure Laterality Date  [place]      History reviewed. No pertinent family history.  History   Social History  . Marital Status: Single    Spouse Name: N/A  . Number of Children: N/A  . Years of Education: N/A   Occupational History  . Not on file.   Social History Main Topics  . Smoking status: Never Smoker   . Smokeless tobacco: Never Used  . Alcohol Use: 4.2 oz/week    7 Cans of beer per week  . Drug Use: No  . Sexual Activity: Not on file   Other Topics Concern  . Not on file   Social History Narrative    Review of Systems  Constitutional: Denies fever, chills, fatigue, or significant weight gain/loss. HENT: Head: Denies headache or neck pain Ears: Denies changes in hearing, ringing in ears, earache, drainage Nose: Denies discharge, stuffiness, itching, nosebleed, sinus pain Throat: Denies  sore throat, hoarseness, dry mouth, sores, thrush Eyes: Denies loss/changes in vision, pain, redness, blurry/double vision, flashing lights Cardiovascular: Denies chest pain/discomfort, tightness, palpitations, shortness of breath with activity, difficulty lying down, swelling, sudden awakening with shortness of breath Respiratory: Denies shortness of breath, cough, sputum production, wheezing Gastrointestinal: Denies dysphasia, heartburn, change in appetite, nausea, change in bowel habits, rectal bleeding, constipation, diarrhea, yellow skin or eyes Genitourinary: Denies frequency, urgency, burning/pain, blood in urine, incontinence, change in urinary strength. Musculoskeletal: Denies muscle/joint pain, stiffness, back pain, redness or swelling of joints, trauma Skin: Denies rashes, lumps, itching, dryness, color changes, or hair/nail changes Associated symptom of itchiness in the groin has been going on for about 2 years but waxing and waning during that time. Has been treated OTC antifungals which have helped with symptoms.  Neurological: Denies dizziness, fainting, seizures, weakness, numbness, tingling, tremor Psychiatric - Denies nervousness, stress, depression or memory loss Endocrine: Denies heat or cold intolerance, sweating, frequent urination, excessive thirst, changes in appetite Hematologic: Denies ease of bruising or bleeding     Objective:     BP 124/90 mmHg  Pulse 69  Temp(Src) 97.6 F (36.4 C) (Oral)  Ht 5' 10.5" (1.791 m)  Wt 307 lb 8 oz (139.481 kg)  BMI 43.48 kg/m2  SpO2 95% Nursing note and vital signs reviewed.  Physical Exam  Constitutional: He is  oriented to person, place, and time. He appears well-developed and well-nourished.  HENT:  Head: Normocephalic.  Right Ear: Hearing, tympanic membrane, external ear and ear canal normal.  Left Ear: Hearing, tympanic membrane, external ear and ear canal normal.  Nose: Nose normal.  Mouth/Throat: Uvula is midline,  oropharynx is clear and moist and mucous membranes are normal.  Eyes: Conjunctivae and EOM are normal. Pupils are equal, round, and reactive to light.  Neck: Neck supple. No JVD present. No tracheal deviation present. No thyromegaly present.  Cardiovascular: Normal rate, regular rhythm, normal heart sounds and intact distal pulses.   Pulmonary/Chest: Effort normal and breath sounds normal.  Abdominal: Soft. Bowel sounds are normal. He exhibits no distension and no mass. There is no tenderness. There is no rebound and no guarding.  Musculoskeletal: Normal range of motion. He exhibits no edema or tenderness.  Lymphadenopathy:    He has no cervical adenopathy.  Neurological: He is alert and oriented to person, place, and time. He has normal reflexes. No cranial nerve deficit. He exhibits normal muscle tone. Coordination normal.  Skin: Skin is warm and dry.  No rash noted at present.  Psychiatric: He has a normal mood and affect. His behavior is normal. Judgment and thought content normal.       Assessment & Plan:

## 2014-12-02 ENCOUNTER — Telehealth: Payer: Self-pay | Admitting: Family

## 2014-12-02 NOTE — Telephone Encounter (Signed)
Left detailed message letting pt know results below. 

## 2014-12-02 NOTE — Telephone Encounter (Signed)
Please inform the patient that his blood work shows that his kidney function, electrolytes, cholesterol and white/red blood cells are normal. Therefore no treatments are needed at this time and we can plan to follow up in 1 year or sooner if needed.

## 2014-12-16 ENCOUNTER — Ambulatory Visit (INDEPENDENT_AMBULATORY_CARE_PROVIDER_SITE_OTHER): Payer: Managed Care, Other (non HMO) | Admitting: *Deleted

## 2014-12-16 ENCOUNTER — Encounter: Payer: Self-pay | Admitting: *Deleted

## 2014-12-16 VITALS — BP 128/82

## 2014-12-16 DIAGNOSIS — R030 Elevated blood-pressure reading, without diagnosis of hypertension: Secondary | ICD-10-CM

## 2014-12-16 DIAGNOSIS — IMO0001 Reserved for inherently not codable concepts without codable children: Secondary | ICD-10-CM

## 2014-12-21 NOTE — Progress Notes (Signed)
Patient ID: Carlos Boyd, male   DOB: 04/28/1981, 34 y.o.   MRN: [id number]  Blood pressure reviewed and improved since previous visit. Continue without medication at this time.

## 2015-06-06 ENCOUNTER — Ambulatory Visit (INDEPENDENT_AMBULATORY_CARE_PROVIDER_SITE_OTHER): Payer: Managed Care, Other (non HMO)

## 2015-06-06 DIAGNOSIS — Z23 Encounter for immunization: Secondary | ICD-10-CM | POA: Diagnosis not present

## 2016-01-19 ENCOUNTER — Emergency Department (HOSPITAL_COMMUNITY): Payer: Managed Care, Other (non HMO)

## 2016-01-19 ENCOUNTER — Encounter (HOSPITAL_COMMUNITY): Payer: Self-pay | Admitting: *Deleted

## 2016-01-19 DIAGNOSIS — R21 Rash and other nonspecific skin eruption: Secondary | ICD-10-CM | POA: Diagnosis not present

## 2016-01-19 DIAGNOSIS — Z5321 Procedure and treatment not carried out due to patient leaving prior to being seen by health care provider: Secondary | ICD-10-CM | POA: Diagnosis not present

## 2016-01-19 DIAGNOSIS — R1030 Lower abdominal pain, unspecified: Secondary | ICD-10-CM | POA: Insufficient documentation

## 2016-01-19 DIAGNOSIS — N50811 Right testicular pain: Secondary | ICD-10-CM | POA: Diagnosis not present

## 2016-01-19 NOTE — ED Notes (Signed)
Pt states he had sudden onset of right testicle pain. States that the pain is worse when walking. States he has had a rash to the area. States that pain radiated to RLQ.

## 2016-01-20 ENCOUNTER — Emergency Department (HOSPITAL_COMMUNITY)
Admission: EM | Admit: 2016-01-20 | Discharge: 2016-01-20 | Disposition: A | Discharge status: Home / Self Care | Payer: Managed Care, Other (non HMO) | Attending: Emergency Medicine | Admitting: Emergency Medicine

## 2016-01-20 DIAGNOSIS — R52 Pain, unspecified: Secondary | ICD-10-CM

## 2016-01-20 NOTE — ED Notes (Signed)
Pt went on my chart and saw that his ultrasound was normal and decided that he is going to go home and will come back if he needs to

## 2016-02-01 ENCOUNTER — Encounter: Payer: Self-pay | Admitting: Family

## 2016-02-01 ENCOUNTER — Ambulatory Visit (INDEPENDENT_AMBULATORY_CARE_PROVIDER_SITE_OTHER): Payer: Managed Care, Other (non HMO) | Admitting: Family

## 2016-02-01 VITALS — BP 138/82 | HR 67 | Temp 98.3°F | Resp 16 | Ht 70.5 in | Wt 302.0 lb

## 2016-02-01 DIAGNOSIS — B356 Tinea cruris: Secondary | ICD-10-CM

## 2016-02-01 DIAGNOSIS — N50811 Right testicular pain: Secondary | ICD-10-CM | POA: Diagnosis not present

## 2016-02-01 MED ORDER — CLOTRIMAZOLE-BETAMETHASONE 1-0.05 % EX CREA: 1.0000 "application " | TOPICAL_CREAM | Freq: Two times a day (BID) | CUTANEOUS | Status: DC

## 2016-02-01 NOTE — Assessment & Plan Note (Signed)
Symptoms consistent with tinea cruris that is managed with Tinactin. And occasional exacerbations. Start Lotrisone as needed for exacerbations. Instructed not to use medication for longer than a week if no improvement is noted. Encouraged to wear non-cotton and loosefitting underwear. Follow-up if symptoms worsen or do not improve.

## 2016-02-01 NOTE — Patient Instructions (Signed)
Thank you for choosing [facility].  Summary/Instructions:  Please use the Lotrisone as needed.  Continue with the Tinactin.   Recommend loose-fitting, non-cotton underwear for prevention.  Your prescription(s) have been submitted to your pharmacy or been printed and provided for you. Please take as directed and contact our office if you believe you are having problem(s) with the medication(s) or have any questions.  If your symptoms worsen or fail to improve, please contact our office for further instruction, or in case of emergency go directly to the emergency room at the closest medical facility.   Jock Itch Jock itch (tinea cruris) is a fungal infection of the skin in the groin area. It is sometimes called ringworm, even though it is not caused by worms. It is caused by a fungus, which is a type of germ that thrives in dark, damp places. Jock itch causes a rash and itching in the groin and upper thigh area. It usually goes away in 2-3 weeks with treatment. CAUSES The fungus that causes jock itch may be spread by:  Touching a fungus infection elsewhere on your body--such as athlete's foot--and then touching your groin area.  Sharing towels or clothing with an infected person. RISK FACTORS Jock itch is most common in men and adolescent boys. This condition is more likely to develop from:  Being in hot, humid climates.  Wearing tight-fitting clothing or wet bathing suits for long periods of time.  Participating in sports.  Being overweight.  Having diabetes. SYMPTOMS Symptoms of jock itch may include:  A red, pink, or brown rash in the groin area. The rash may spread to the thighs, anus, and buttocks.  Dry and scaly skin on or around the rash.  Itchiness. DIAGNOSIS Most often, a health care provider can make the diagnosis by looking at your rash. Sometimes, a scraping of the infected skin will be taken. This sample may be tested by looking at it under a microscope  or by trying to grow the fungus from the sample (culture).  TREATMENT Treatment for this condition may include:  Antifungal medicine to kill the fungus. This may be in various forms:  Skin cream or ointment.  Medicine taken by mouth.  Skin cream or ointment to reduce the itching.  Compresses or medicated powders to dry the infected skin. HOME CARE INSTRUCTIONS  Take medicines only as directed by your health care provider. Apply skin creams or ointments exactly as directed.  Wear loose-fitting clothing.  Men should wear cotton boxer shorts.  Women should wear cotton underwear.  Change your underwear every day to keep your groin dry.  Avoid hot baths.  Dry your groin area well after bathing.  Use a separate towel to dry your groin area. This will help to prevent a spreading of the infection to other areas of your body.  Do not scratch the affected area.  Do not share towels with other people. SEEK MEDICAL CARE IF:  Your rash does not improve or it gets worse after 2 weeks of treatment.  Your rash is spreading.  Your rash returns after treatment is finished.  You have a fever.  You have redness, swelling, or pain in the area around your rash.  You have fluid, blood, or pus coming from your rash.  Your have your rash for more than 4 weeks.   This information is not intended to replace advice given to you by your health care provider. Make sure you discuss any questions you have with your health  care provider.   Document Released: 06/29/2002 Document Revised: 07/30/2014 Document Reviewed: 04/20/2014 Elsevier Interactive Patient Education [employer]2016 Elsevier Inc.

## 2016-02-01 NOTE — Assessment & Plan Note (Signed)
Right testicular pain resolved with no treatments or further episodes. Previous imaging was negative. Declines physical exam today. Differentials may include possible transient torsion, blockage, or hernia. Encouraged to continue to monitor at this time.

## 2016-02-01 NOTE — Progress Notes (Addendum)
Subjective:    Patient ID: Carlos Boyd, male    DOB: 09/05/1980, 35 y.o.   MRN: [id number]  Chief Complaint  Patient presents with  . Testicle Pain    jock itch and testicular pain    HPI:  Carlos MaserJustin Boyd is a 35 y.o. male who  has no past medical history on file. and presents today for an acute office visit.   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 Describes occasional itching but symptoms have improved since initial onset a couple of weeks ago. Notes that if he does not put the medication it can be out of control.    2.) Testicular pain - this is a new problem. Associated symptom of pain located in his right testicle described as sharp with a severity that caused him to go to the ED with symptom resolving symptoms without treatment.Describes a line of distinct sharp pain. No trauma that he can recall. Previous imaging was normal.     No Known Allergies   Outpatient Prescriptions Prior to Visit  Medication Sig Dispense Refill  . meloxicam (MOBIC) 15 MG tablet Take 15 mg by mouth daily as needed for pain. Patient stated that they take very rarely.     No facility-administered medications prior to visit.    Review of Systems  Constitutional: Negative for fever and chills.  Genitourinary: Positive for testicular pain. Negative for dysuria, urgency, frequency, hematuria, flank pain, decreased urine volume, discharge, penile swelling, scrotal swelling, difficulty urinating, genital sores and penile pain.  Skin: Negative for rash.      Objective:    BP 138/82 mmHg  Pulse 67  Temp(Src) 98.3 F (36.8 C) (Oral)  Resp 16  Ht 5' 10.5" (1.791 m)  Wt 302 lb (136.986 kg)  BMI 42.71 kg/m2  SpO2 95% Nursing note and vital signs reviewed.  Physical Exam  Constitutional: He is oriented to person, place, and time. He appears well-developed and well-nourished. No distress.  Cardiovascular: Normal rate, regular rhythm, normal heart  sounds and intact distal pulses.   Pulmonary/Chest: Effort normal and breath sounds normal.  Genitourinary:  Declines GU exam  Neurological: He is alert and oriented to person, place, and time.  Skin: Skin is warm and dry.  Psychiatric: He has a normal mood and affect. His behavior is normal. Judgment and thought content normal.       Assessment & Plan:   Problem List Items Addressed This Visit      Musculoskeletal and Integument   Tinea cruris - Primary    Symptoms consistent with tinea cruris that is managed with Tinactin. And occasional exacerbations. Start Lotrisone as needed for exacerbations. Instructed not to use medication for longer than a week if no improvement is noted. Encouraged to wear non-cotton and loosefitting underwear. Follow-up if symptoms worsen or do not improve.      Relevant Medications   clotrimazole-betamethasone (LOTRISONE) cream     Other   Testicular pain, right    Right testicular pain resolved with no treatments or further episodes. Previous imaging was negative. Declines physical exam today. Differentials may include possible transient torsion, blockage, or hernia. Encouraged to continue to monitor at this time.          I have discontinued Mr. Theone StanleyBramley's meloxicam. I am also having him start on clotrimazole-betamethasone.   Meds ordered this encounter  Medications  . clotrimazole-betamethasone (LOTRISONE) cream    Sig: Apply 1 application topically 2 (two) times daily.  Dispense:  30 g    Refill:  0    Order Specific Question:  Supervising Provider    Answer:  Hillard Danker A [[id number]]     Follow-up: Return if symptoms worsen or fail to improve.  Jeanine Luz, FNP

## 2016-02-29 ENCOUNTER — Ambulatory Visit (INDEPENDENT_AMBULATORY_CARE_PROVIDER_SITE_OTHER): Payer: Managed Care, Other (non HMO)

## 2016-02-29 DIAGNOSIS — Z111 Encounter for screening for respiratory tuberculosis: Secondary | ICD-10-CM | POA: Diagnosis not present

## 2016-03-02 LAB — TB SKIN TEST
Induration: 0 mm
TB Skin Test: NEGATIVE

## 2016-06-01 ENCOUNTER — Other Ambulatory Visit (INDEPENDENT_AMBULATORY_CARE_PROVIDER_SITE_OTHER): Payer: Managed Care, Other (non HMO)

## 2016-06-01 ENCOUNTER — Ambulatory Visit (INDEPENDENT_AMBULATORY_CARE_PROVIDER_SITE_OTHER): Payer: Managed Care, Other (non HMO) | Admitting: Family

## 2016-06-01 ENCOUNTER — Encounter: Payer: Self-pay | Admitting: Family

## 2016-06-01 VITALS — BP 128/88 | HR 71 | Temp 98.0°F | Resp 16 | Ht 70.5 in | Wt 297.0 lb

## 2016-06-01 DIAGNOSIS — Z0000 Encounter for general adult medical examination without abnormal findings: Secondary | ICD-10-CM

## 2016-06-01 DIAGNOSIS — Z23 Encounter for immunization: Secondary | ICD-10-CM

## 2016-06-01 LAB — COMPREHENSIVE METABOLIC PANEL
ALBUMIN: 4.2 g/dL (ref 3.5–5.2)
ALK PHOS: 72 U/L (ref 39–117)
ALT: 29 U/L (ref 0–53)
AST: 16 U/L (ref 0–37)
BUN: 15 mg/dL (ref 6–23)
CALCIUM: 9.4 mg/dL (ref 8.4–10.5)
CO2: 28 mEq/L (ref 19–32)
Chloride: 107 mEq/L (ref 96–112)
Creatinine, Ser: 0.89 mg/dL (ref 0.40–1.50)
GFR: 102.17 mL/min (ref >60.00)
Glucose, Bld: 87 mg/dL (ref 70–99)
POTASSIUM: 4.8 meq/L (ref 3.5–5.1)
Sodium: 141 mEq/L (ref 135–145)
TOTAL PROTEIN: 7.2 g/dL (ref 6.0–8.3)
Total Bilirubin: 0.5 mg/dL (ref 0.2–1.2)

## 2016-06-01 LAB — LIPID PANEL
CHOLESTEROL: 165 mg/dL (ref 0–200)
HDL: 46.6 mg/dL (ref >39.00)
LDL Cholesterol: 106 mg/dL — ABNORMAL HIGH (ref 0–99)
NonHDL: 118.75
Total CHOL/HDL Ratio: 4
Triglycerides: 63 mg/dL (ref 0.0–149.0)
VLDL: 12.6 mg/dL (ref 0.0–40.0)

## 2016-06-01 LAB — CBC
HEMATOCRIT: 43.5 % (ref 39.0–52.0)
HEMOGLOBIN: 15 g/dL (ref 13.0–17.0)
MCHC: 34.4 g/dL (ref 30.0–36.0)
MCV: 86.2 fl (ref 78.0–100.0)
PLATELETS: 290 10*3/uL (ref 150.0–400.0)
RBC: 5.05 Mil/uL (ref 4.22–5.81)
RDW: 12.9 % (ref 11.5–15.5)
WBC: 7.8 10*3/uL (ref 4.0–10.5)

## 2016-06-01 NOTE — Progress Notes (Signed)
Subjective:    Patient ID: Carlos MaserJustin Pryde, male    DOB: 05/28/1981, 35 y.o.   MRN: [id number]  Chief Complaint  Patient presents with  . CPE    fasting    HPI:  Carlos Boyd is a 35 y.o. male who presents today for an annual wellness visit.   1) Health Maintenance -   Diet - Averages about 3 meals per day consisting of a regular diet; Caffeine intake of about 4-5 cups per day  Exercise - No structured exercise   2) Preventative Exams / Immunizations:  Dental -- Up to date  Vision -- Due for exam   Health Maintenance  Topic Date Due  . HIV Screening  04/24/1994  . INFLUENZA VACCINE  02/21/2016  . TETANUS/TDAP  11/28/2024    Immunization History  Administered Date(s) Administered  . Influenza,inj,Quad PF,36+ Mos 06/06/2015, 06/01/2016  . PPD Test 02/29/2016  . Tdap 11/29/2014     No Known Allergies   Outpatient Medications Prior to Visit  Medication Sig Dispense Refill  . clotrimazole-betamethasone (LOTRISONE) cream Apply 1 application topically 2 (two) times daily. 30 g 0   No facility-administered medications prior to visit.      History reviewed. No pertinent past medical history.   Past Surgical History:  Procedure Laterality Date  . [place]  . WISDOM TOOTH EXTRACTION       Family History  Problem Relation Age of Onset  . Healthy Mother   . Healthy Father   . Diabetes Maternal Grandfather      Social History   Social History  . Marital status: Single    Spouse name: N/A  . Number of children: 0  . Years of education: [id number]   Occupational History  . Software Engineering geologistTechnical Manager    Social History Main Topics  . Smoking status: Never Smoker  . Smokeless tobacco: Never Used  . Alcohol use 0.6 oz/week    1 Cans of beer per week  . Drug use: No  . Sexual activity: Not on file   Other Topics Concern  . Not on file   Social History Narrative   Fun: Garden.   Denies religious beliefs that would  effect health care.       Review of Systems  Constitutional: Denies fever, chills, fatigue, or significant weight gain/loss. HENT: Head: Denies headache or neck pain Ears: Denies changes in hearing, ringing in ears, earache, drainage Nose: Denies discharge, stuffiness, itching, nosebleed, sinus pain Throat: Denies sore throat, hoarseness, dry mouth, sores, thrush Eyes: Denies loss/changes in vision, pain, redness, blurry/double vision, flashing lights Cardiovascular: Denies chest pain/discomfort, tightness, palpitations, shortness of breath with activity, difficulty lying down, swelling, sudden awakening with shortness of breath Respiratory: Denies shortness of breath, cough, sputum production, wheezing Gastrointestinal: Denies dysphasia, heartburn, change in appetite, nausea, change in bowel habits, rectal bleeding, constipation, diarrhea, yellow skin or eyes Genitourinary: Denies frequency, urgency, burning/pain, blood in urine, incontinence, change in urinary strength. Musculoskeletal: Denies muscle/joint pain, stiffness, back pain, redness or swelling of joints, trauma Skin: Denies rashes, lumps, itching, dryness, color changes, or hair/nail changes Neurological: Denies dizziness, fainting, seizures, weakness, numbness, tingling, tremor Psychiatric - Denies nervousness, stress, depression or memory loss Endocrine: Denies heat or cold intolerance, sweating, frequent urination, excessive thirst, changes in appetite Hematologic: Denies ease of bruising or bleeding     Objective:     BP 128/88 (BP Location: Left Arm, Patient Position: Sitting, Cuff Size: Large)  Pulse 71   Temp 98 F (36.7 C) (Oral)   Resp 16   Ht 5' 10.5" (1.791 m)   Wt 297 lb (134.7 kg)   SpO2 96%   BMI 42.01 kg/m  Nursing note and vital signs reviewed.  Physical Exam  Constitutional: He is oriented to person, place, and time. He appears well-developed and well-nourished.  HENT:  Head: Normocephalic.    Right Ear: Hearing, tympanic membrane, external ear and ear canal normal.  Left Ear: Hearing, tympanic membrane, external ear and ear canal normal.  Nose: Nose normal.  Mouth/Throat: Uvula is midline, oropharynx is clear and moist and mucous membranes are normal.  Eyes: Conjunctivae and EOM are normal. Pupils are equal, round, and reactive to light.  Neck: Neck supple. No JVD present. No tracheal deviation present. No thyromegaly present.  Cardiovascular: Normal rate, regular rhythm, normal heart sounds and intact distal pulses.   Pulmonary/Chest: Effort normal and breath sounds normal.  Abdominal: Soft. Bowel sounds are normal. He exhibits no distension and no mass. There is no tenderness. There is no rebound and no guarding.  Musculoskeletal: Normal range of motion. He exhibits no edema or tenderness.  Lymphadenopathy:    He has no cervical adenopathy.  Neurological: He is alert and oriented to person, place, and time. He has normal reflexes. No cranial nerve deficit. He exhibits normal muscle tone. Coordination normal.  Skin: Skin is warm and dry.  Psychiatric: He has a normal mood and affect. His behavior is normal. Judgment and thought content normal.       Assessment & Plan:   Problem List Items Addressed This Visit      Other   Routine general medical examination at a health care facility - Primary    1) Anticipatory Guidance: Discussed importance of wearing a seatbelt while driving and not texting while driving; changing batteries in smoke detector at least once annually; wearing suntan lotion when outside; eating a balanced and moderate diet; getting physical activity at least 30 minutes per day.  2) Immunizations / Screenings / Labs:  Influenza updated today. All other immunizations are up-to-date per recommendations. Due for a vision exam encouraged to be completed independently. All other screenings are up-to-date per recommendations. Obtain CBC, CMET, and lipid profile.      Overall well exam with risk factors for cardiovascular disease including obesity. Recommend weight loss of 5-7% of current body weight through nutrition and physical activity. Goal is to increase physical activity to 30 minutes of moderate level activity daily. Continue other healthy lifestyle behaviors and choices. Follow-up prevention exam in 1 year. Follow-up office visit pending blood work if needed.      Relevant Orders   CBC (Completed)   Comprehensive metabolic panel (Completed)   Lipid panel (Completed)    Other Visit Diagnoses    Encounter for immunization       Relevant Orders   Flu Vaccine QUAD 36+ mos IM (Completed)       I am having Mr. Carlos Boyd maintain his clotrimazole-betamethasone.   Follow-up: Return in about 1 year (around 06/01/2017), or if symptoms worsen or fail to improve.   Jeanine Luzalone, Gregory, FNP

## 2016-06-01 NOTE — Patient Instructions (Addendum)
Thank you for choosing [facility].  SUMMARY AND INSTRUCTIONS:   Labs:  Please stop by the lab on the lower level of the building for your blood work. Your results will be released to MyChart (or called to you) after review, usually within 72 hours after test completion. If any changes need to be made, you will be notified at that same time.  1.) The lab is open from 7:30am to 5:30 pm Monday-Friday 2.) No appointment is necessary 3.) Fasting (if needed) is 6-8 hours after food and drink; black coffee and water are okay   Health Maintenance, Male A healthy lifestyle and preventative care can promote health and wellness.  Maintain regular health, dental, and eye exams.  Eat a healthy diet. Foods like vegetables, fruits, whole grains, low-fat dairy products, and lean protein foods contain the nutrients you need and are low in calories. Decrease your intake of foods high in solid fats, added sugars, and salt. Get information about a proper diet from your health care provider, if necessary.  Regular physical exercise is one of the most important things you can do for your health. Most adults should get at least 150 minutes of moderate-intensity exercise (any activity that increases your heart rate and causes you to sweat) each week. In addition, most adults need muscle-strengthening exercises on 2 or more days a week.   Maintain a healthy weight. The body mass index (BMI) is a screening tool to identify possible weight problems. It provides an estimate of body fat based on height and weight. Your health care provider can find your BMI and can help you achieve or maintain a healthy weight. For males 20 years and older:  A BMI below 18.5 is considered underweight.  A BMI of 18.5 to 24.9 is normal.  A BMI of 25 to 29.9 is considered overweight.  A BMI of 30 and above is considered obese.  Maintain normal blood lipids and cholesterol by exercising and minimizing your intake of  saturated fat. Eat a balanced diet with plenty of fruits and vegetables. Blood tests for lipids and cholesterol should begin at age 20 and be repeated every 5 years. If your lipid or cholesterol levels are high, you are over age 50, or you are at high risk for heart disease, you may need your cholesterol levels checked more frequently.Ongoing high lipid and cholesterol levels should be treated with medicines if diet and exercise are not working.  If you smoke, find out from your health care provider how to quit. If you do not use tobacco, do not start.  Lung cancer screening is recommended for adults aged 55-80 years who are at high risk for developing lung cancer because of a history of smoking. A yearly low-dose CT scan of the lungs is recommended for people who have at least a 30-pack-year history of smoking and are current smokers or have quit within the past 15 years. A pack year of smoking is smoking an average of 1 pack of cigarettes a day for 1 year (for example, a 30-pack-year history of smoking could mean smoking 1 pack a day for 30 years or 2 packs a day for 15 years). Yearly screening should continue until the smoker has stopped smoking for at least 15 years. Yearly screening should be stopped for people who develop a health problem that would prevent them from having lung cancer treatment.  If you choose to drink alcohol, do not have more than 2 drinks per day. One drink is considered   to be 12 oz (360 mL) of beer, 5 oz (150 mL) of wine, or 1.5 oz (45 mL) of liquor.  Avoid the use of street drugs. Do not share needles with anyone. Ask for help if you need support or instructions about stopping the use of drugs.  High blood pressure causes heart disease and increases the risk of stroke. High blood pressure is more likely to develop in:  People who have blood pressure in the end of the normal range (100-139/85-89 mm Hg).  People who are overweight or obese.  People who are African  American.  If you are 18-39 years of age, have your blood pressure checked every 3-5 years. If you are 40 years of age or older, have your blood pressure checked every year. You should have your blood pressure measured twice--once when you are at a hospital or clinic, and once when you are not at a hospital or clinic. Record the average of the two measurements. To check your blood pressure when you are not at a hospital or clinic, you can use:  An automated blood pressure machine at a pharmacy.  A home blood pressure monitor.  If you are 45-79 years old, ask your health care provider if you should take aspirin to prevent heart disease.  Diabetes screening involves taking a blood sample to check your fasting blood sugar level. This should be done once every 3 years after age 45 if you are at a normal weight and without risk factors for diabetes. Testing should be considered at a younger age or be carried out more frequently if you are overweight and have at least 1 risk factor for diabetes.  Colorectal cancer can be detected and often prevented. Most routine colorectal cancer screening begins at the age of 50 and continues through age 75. However, your health care provider may recommend screening at an earlier age if you have risk factors for colon cancer. On a yearly basis, your health care provider may provide home test kits to check for hidden blood in the stool. A small camera at the end of a tube may be used to directly examine the colon (sigmoidoscopy or colonoscopy) to detect the earliest forms of colorectal cancer. Talk to your health care provider about this at age 50 when routine screening begins. A direct exam of the colon should be repeated every 5-10 years through age 75, unless early forms of precancerous polyps or small growths are found.  People who are at an increased risk for hepatitis B should be screened for this virus. You are considered at high risk for hepatitis B if:  You were  born in a country where hepatitis B occurs often. Talk with your health care provider about which countries are considered high risk.  Your parents were born in a high-risk country and you have not received a shot to protect against hepatitis B (hepatitis B vaccine).  You have HIV or AIDS.  You use needles to inject street drugs.  You live with, or have sex with, someone who has hepatitis B.  You are a man who has sex with other men (MSM).  You get hemodialysis treatment.  You take certain medicines for conditions like cancer, organ transplantation, and autoimmune conditions.  Hepatitis C blood testing is recommended for all people born from 1945 through 1965 and any individual with known risk factors for hepatitis C.  Healthy men should no longer receive prostate-specific antigen (PSA) blood tests as part of routine cancer screening. Talk   to your health care provider about prostate cancer screening.  Testicular cancer screening is not recommended for adolescents or adult males who have no symptoms. Screening includes self-exam, a health care provider exam, and other screening tests. Consult with your health care provider about any symptoms you have or any concerns you have about testicular cancer.  Practice safe sex. Use condoms and avoid high-risk sexual practices to reduce the spread of sexually transmitted infections (STIs).  You should be screened for STIs, including gonorrhea and chlamydia if:  You are sexually active and are younger than 24 years.  You are older than 24 years, and your health care provider tells you that you are at risk for this type of infection.  Your sexual activity has changed since you were last screened, and you are at an increased risk for chlamydia or gonorrhea. Ask your health care provider if you are at risk.  If you are at risk of being infected with HIV, it is recommended that you take a prescription medicine daily to prevent HIV infection. This is  called pre-exposure prophylaxis (PrEP). You are considered at risk if:  You are a man who has sex with other men (MSM).  You are a heterosexual man who is sexually active with multiple partners.  You take drugs by injection.  You are sexually active with a partner who has HIV.  Talk with your health care provider about whether you are at high risk of being infected with HIV. If you choose to begin PrEP, you should first be tested for HIV. You should then be tested every 3 months for as long as you are taking PrEP.  Use sunscreen. Apply sunscreen liberally and repeatedly throughout the day. You should seek shade when your shadow is shorter than you. Protect yourself by wearing long sleeves, pants, a wide-brimmed hat, and sunglasses year round whenever you are outdoors.  Tell your health care provider of new moles or changes in moles, especially if there is a change in shape or color. Also, tell your health care provider if a mole is larger than the size of a pencil eraser.  A one-time screening for abdominal aortic aneurysm (AAA) and surgical repair of large AAAs by ultrasound is recommended for men aged 65-75 years who are current or former smokers.  Stay current with your vaccines (immunizations).   This information is not intended to replace advice given to you by your health care provider. Make sure you discuss any questions you have with your health care provider.   Document Released: 01/05/2008 Document Revised: 07/30/2014 Document Reviewed: 12/04/2010 Elsevier Interactive Patient Education 2016 Elsevier Inc.    

## 2016-06-01 NOTE — Assessment & Plan Note (Signed)
1) Anticipatory Guidance: Discussed importance of wearing a seatbelt while driving and not texting while driving; changing batteries in smoke detector at least once annually; wearing suntan lotion when outside; eating a balanced and moderate diet; getting physical activity at least 30 minutes per day.  2) Immunizations / Screenings / Labs:  Influenza updated today. All other immunizations are up-to-date per recommendations. Due for a vision exam encouraged to be completed independently. All other screenings are up-to-date per recommendations. Obtain CBC, CMET, and lipid profile.     Overall well exam with risk factors for cardiovascular disease including obesity. Recommend weight loss of 5-7% of current body weight through nutrition and physical activity. Goal is to increase physical activity to 30 minutes of moderate level activity daily. Continue other healthy lifestyle behaviors and choices. Follow-up prevention exam in 1 year. Follow-up office visit pending blood work if needed.

## 2016-06-03 ENCOUNTER — Encounter: Payer: Self-pay | Admitting: Family

## 2017-05-31 ENCOUNTER — Ambulatory Visit: Payer: Managed Care, Other (non HMO) | Admitting: Internal Medicine

## 2017-05-31 ENCOUNTER — Other Ambulatory Visit (INDEPENDENT_AMBULATORY_CARE_PROVIDER_SITE_OTHER): Payer: Managed Care, Other (non HMO)

## 2017-05-31 ENCOUNTER — Encounter: Payer: Self-pay | Admitting: Internal Medicine

## 2017-05-31 VITALS — BP 136/84 | HR 77 | Temp 98.0°F | Ht 70.5 in | Wt 310.0 lb

## 2017-05-31 DIAGNOSIS — Z23 Encounter for immunization: Secondary | ICD-10-CM | POA: Diagnosis not present

## 2017-05-31 DIAGNOSIS — B356 Tinea cruris: Secondary | ICD-10-CM | POA: Diagnosis not present

## 2017-05-31 DIAGNOSIS — Z0000 Encounter for general adult medical examination without abnormal findings: Secondary | ICD-10-CM | POA: Diagnosis not present

## 2017-05-31 DIAGNOSIS — Z114 Encounter for screening for human immunodeficiency virus [HIV]: Secondary | ICD-10-CM

## 2017-05-31 LAB — LIPID PANEL
Cholesterol: 155 mg/dL (ref 0–200)
HDL: 42.5 mg/dL (ref >39.00)
LDL Cholesterol: 84 mg/dL (ref 0–99)
NONHDL: 112.45
TRIGLYCERIDES: 142 mg/dL (ref 0.0–149.0)
Total CHOL/HDL Ratio: 4
VLDL: 28.4 mg/dL (ref 0.0–40.0)

## 2017-05-31 LAB — BASIC METABOLIC PANEL
BUN: 16 mg/dL (ref 6–23)
CALCIUM: 9.5 mg/dL (ref 8.4–10.5)
CO2: 29 mEq/L (ref 19–32)
CREATININE: 0.87 mg/dL (ref 0.40–1.50)
Chloride: 103 mEq/L (ref 96–112)
GFR: 104.32 mL/min (ref >60.00)
GLUCOSE: 85 mg/dL (ref 70–99)
Potassium: 4.9 mEq/L (ref 3.5–5.1)
SODIUM: 140 meq/L (ref 135–145)

## 2017-05-31 LAB — CBC WITH DIFFERENTIAL/PLATELET
Basophils Absolute: 0.1 10*3/uL (ref 0.0–0.1)
Basophils Relative: 0.9 % (ref 0.0–3.0)
EOS PCT: 2.7 % (ref 0.0–5.0)
Eosinophils Absolute: 0.2 10*3/uL (ref 0.0–0.7)
HCT: 44.1 % (ref 39.0–52.0)
HEMOGLOBIN: 14.8 g/dL (ref 13.0–17.0)
LYMPHS PCT: 23.6 % (ref 12.0–46.0)
Lymphs Abs: 2 10*3/uL (ref 0.7–4.0)
MCHC: 33.6 g/dL (ref 30.0–36.0)
MCV: 86.9 fl (ref 78.0–100.0)
MONOS PCT: 10 % (ref 3.0–12.0)
Monocytes Absolute: 0.8 10*3/uL (ref 0.1–1.0)
Neutro Abs: 5.3 10*3/uL (ref 1.4–7.7)
Neutrophils Relative %: 62.8 % (ref 43.0–77.0)
Platelets: 267 10*3/uL (ref 150.0–400.0)
RBC: 5.08 Mil/uL (ref 4.22–5.81)
RDW: 13.3 % (ref 11.5–15.5)
WBC: 8.4 10*3/uL (ref 4.0–10.5)

## 2017-05-31 LAB — URINALYSIS, ROUTINE W REFLEX MICROSCOPIC
Bilirubin Urine: NEGATIVE
HGB URINE DIPSTICK: NEGATIVE
Ketones, ur: NEGATIVE
LEUKOCYTES UA: NEGATIVE
Nitrite: NEGATIVE
RBC / HPF: NONE SEEN (ref 0–?)
Specific Gravity, Urine: 1.025 (ref 1.000–1.030)
TOTAL PROTEIN, URINE-UPE24: NEGATIVE
URINE GLUCOSE: NEGATIVE
UROBILINOGEN UA: 0.2 (ref 0.0–1.0)
pH: 6 (ref 5.0–8.0)

## 2017-05-31 LAB — HEPATIC FUNCTION PANEL
ALBUMIN: 4.2 g/dL (ref 3.5–5.2)
ALT: 20 U/L (ref 0–53)
AST: 14 U/L (ref 0–37)
Alkaline Phosphatase: 75 U/L (ref 39–117)
Bilirubin, Direct: 0.1 mg/dL (ref 0.0–0.3)
Total Bilirubin: 0.5 mg/dL (ref 0.2–1.2)
Total Protein: 7.4 g/dL (ref 6.0–8.3)

## 2017-05-31 LAB — TSH: TSH: 2.94 u[IU]/mL (ref 0.35–4.50)

## 2017-05-31 MED ORDER — CLOTRIMAZOLE-BETAMETHASONE 1-0.05 % EX CREA: 1.0000 "application " | TOPICAL_CREAM | Freq: Two times a day (BID) | CUTANEOUS | 1 refills | Status: DC

## 2017-05-31 NOTE — Patient Instructions (Addendum)
You had the flu shot today  Please continue all other medications as before, and refills have been done if requested - the lotrisone  Please have the pharmacy call with any other refills you may need.  Please continue your efforts at being more active, low cholesterol diet, and weight control.  You are otherwise up to date with prevention measures today.  Please keep your appointments with your specialists as you may have planned  Please go to the LAB in the Basement (turn left off the elevator) for the tests to be done today  You will be contacted by phone if any changes need to be made immediately.  Otherwise, you will receive a letter about your results with an explanation, but please check with MyChart first.  Please remember to sign up for MyChart if you have not done so, as this will be important to you in the future with finding out test results, communicating by private email, and scheduling acute appointments online when needed.  Please return in 1 year for your yearly visit, or sooner if needed, with Lab testing done 3-5 days before

## 2017-05-31 NOTE — Progress Notes (Signed)
Subjective:    Patient ID: Carlos Boyd, male    DOB: 05/30/1981, 36 y.o.   MRN: [id number]  HPI  Here for wellness and f/u;  Overall doing ok;  Pt denies Chest pain, worsening SOB, DOE, wheezing, orthopnea, PND, worsening LE edema, palpitations, dizziness or syncope.  Pt denies neurological change such as new headache, facial or extremity weakness.  Pt denies polydipsia, polyuria, or low sugar symptoms. Pt states overall good compliance with treatment and medications, good tolerability, and has been trying to follow appropriate diet.  Pt denies worsening depressive symptoms, suicidal ideation or panic. No fever, night sweats, wt loss, loss of appetite, or other constitutional symptoms.  Pt states good ability with ADL's, has low fall risk, home safety reviewed and adequate, no other significant changes in hearing or vision, and not active with regular exercise, but planning on trying to be more active.  No new complaints or interval hx except for mild fungal skin infection to feet.  Has gained several lbs .  Denies OSA symptoms or daytime sleepiness or fatigue Wt Readings from Last 3 Encounters:  05/31/17 (!) 310 lb (140.6 kg)  06/01/16 297 lb (134.7 kg)  02/01/16 (!) 302 lb (137 kg)   Past Medical History:  Diagnosis Date  . Morbid obesity (HCC) 06/02/2017   Past Surgical History:  Procedure Laterality Date  . right [place]  . WISDOM TOOTH EXTRACTION      reports that  has never smoked. he has never used smokeless tobacco. He reports that he drinks about 0.6 oz of alcohol per week. He reports that he does not use drugs. family history includes Diabetes in his maternal grandfather; Healthy in his father and mother. No Known Allergies No current outpatient medications on file prior to visit.   No current facility-administered medications on file prior to visit.    Review of Systems Constitutional: Negative for other unusual diaphoresis, sweats, appetite or  weight changes HENT: Negative for other worsening hearing loss, ear pain, facial swelling, mouth sores or neck stiffness.   Eyes: Negative for other worsening pain, redness or other visual disturbance.  Respiratory: Negative for other stridor or swelling Cardiovascular: Negative for other palpitations or other chest pain  Gastrointestinal: Negative for worsening diarrhea or loose stools, blood in stool, distention or other pain Genitourinary: Negative for hematuria, flank pain or other change in urine volume.  Musculoskeletal: Negative for myalgias or other joint swelling.  Skin: Negative for other color change, or other wound or worsening drainage.  Neurological: Negative for other syncope or numbness. Hematological: Negative for other adenopathy or swelling Psychiatric/Behavioral: Negative for hallucinations, other worsening agitation, SI, self-injury, or new decreased concentration All other system neg per pt    Objective:   Physical Exam BP 136/84   Pulse 77   Temp 98 F (36.7 C) (Oral)   Ht 5' 10.5" (1.791 m)   Wt (!) 310 lb (140.6 kg)   SpO2 98%   BMI 43.85 kg/m  VS noted,  Constitutional: Pt is oriented to person, place, and time. Appears well-developed and well-nourished, in no significant distress and comfortable Head: Normocephalic and atraumatic  Eyes: Conjunctivae and EOM are normal. Pupils are equal, round, and reactive to light Right Ear: External ear normal without discharge Left Ear: External ear normal without discharge Nose: Nose without discharge or deformity Mouth/Throat: Oropharynx is without other ulcerations and moist  Neck: Normal range of motion. Neck supple. No JVD present. No tracheal deviation  present or significant neck LA or mass Cardiovascular: Normal rate, regular rhythm, normal heart sounds and intact distal pulses.   Pulmonary/Chest: WOB normal and breath sounds without rales or wheezing  Abdominal: Soft. Bowel sounds are normal. NT. No HSM    Musculoskeletal: Normal range of motion. Exhibits no edema Lymphadenopathy: Has no other cervical adenopathy.  Neurological: Pt is alert and oriented to person, place, and time. Pt has normal reflexes. No cranial nerve deficit. Motor grossly intact, Gait intact Skin: Skin is warm and dry. No rash noted or new ulcerations Psychiatric:  Has normal mood and affect. Behavior is normal without agitation No other exam findings    Assessment & Plan:

## 2017-06-01 LAB — HIV ANTIBODY (ROUTINE TESTING W REFLEX): HIV: NONREACTIVE

## 2017-06-02 ENCOUNTER — Encounter: Payer: Self-pay | Admitting: Internal Medicine

## 2017-06-02 HISTORY — DX: Morbid (severe) obesity due to excess calories: E66.01

## 2017-06-02 NOTE — Assessment & Plan Note (Signed)
Ok for lotrisone prn,  to f/u any worsening symptoms or concerns  

## 2017-06-02 NOTE — Assessment & Plan Note (Signed)

## 2017-08-07 IMAGING — US US ART/VEN ABD/PELV/SCROTUM DOPPLER LTD
1 series · 14 of 25 positions shown · non-contrast
Comparison: None.

CLINICAL DATA: 34-year-old male with sudden onset right testicular
pain

EXAM:
SCROTAL ULTRASOUND
DOPPLER ULTRASOUND OF THE TESTICLES
TECHNIQUE: Complete ultrasound examination of the testicles, epididymis, and
other scrotal structures was performed. Color and spectral Doppler
ultrasound were also utilized to evaluate blood flow to the
testicles.

[Series 1: us art/ven abd/pelv/scrotum doppler ltd · 0.06mm/px · 14 of 41 slices shown]
[im 1/41]
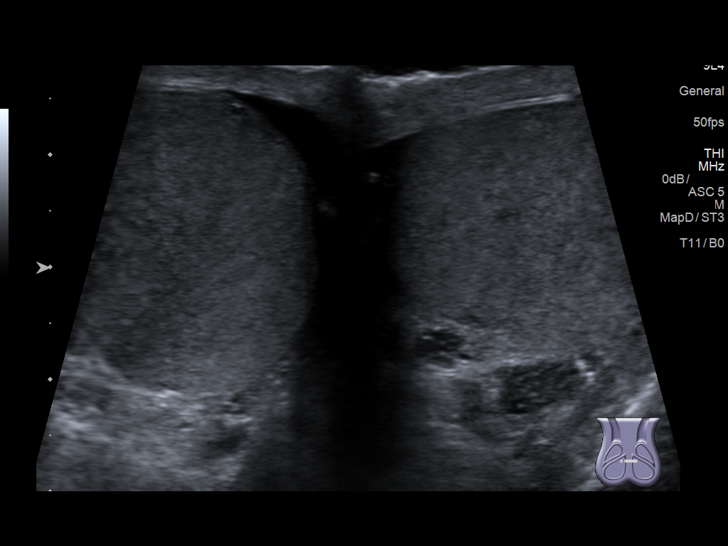
[im 4/41]
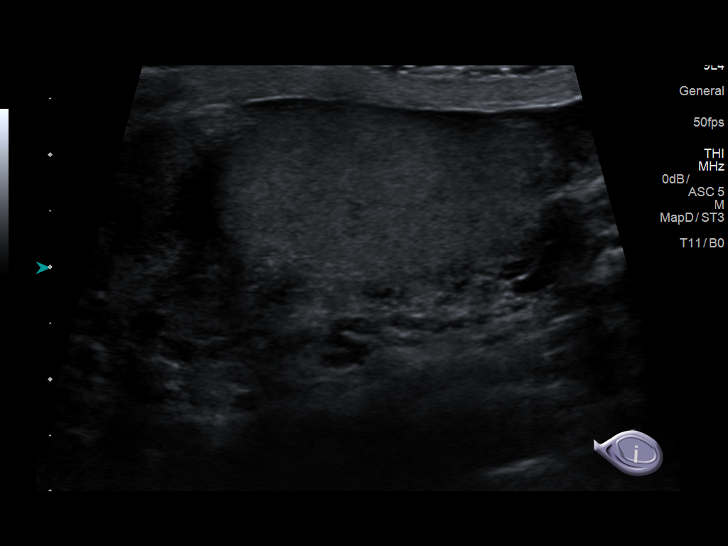
[im 7/41]
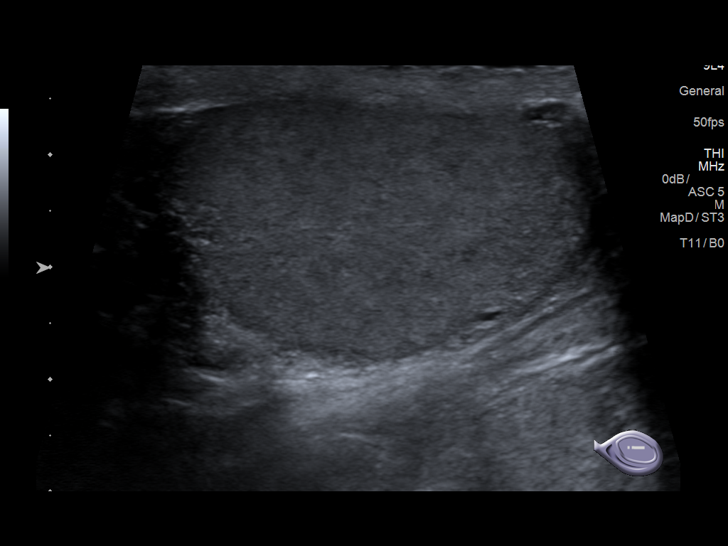
[im 11/41]
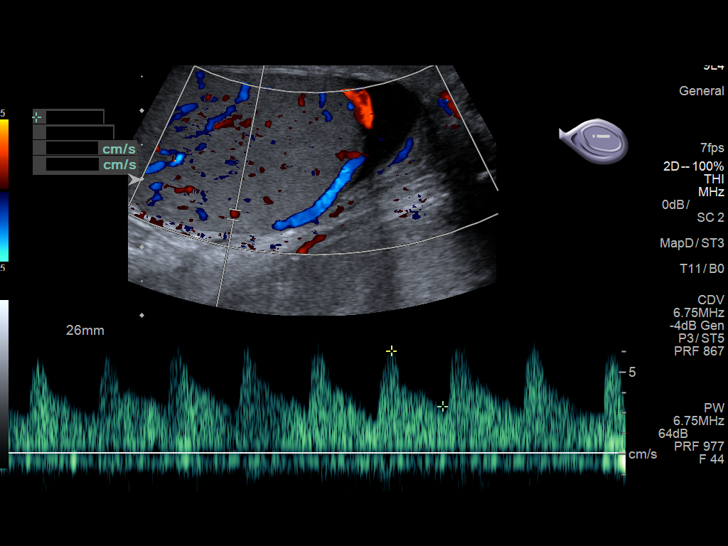
[im 14/41]
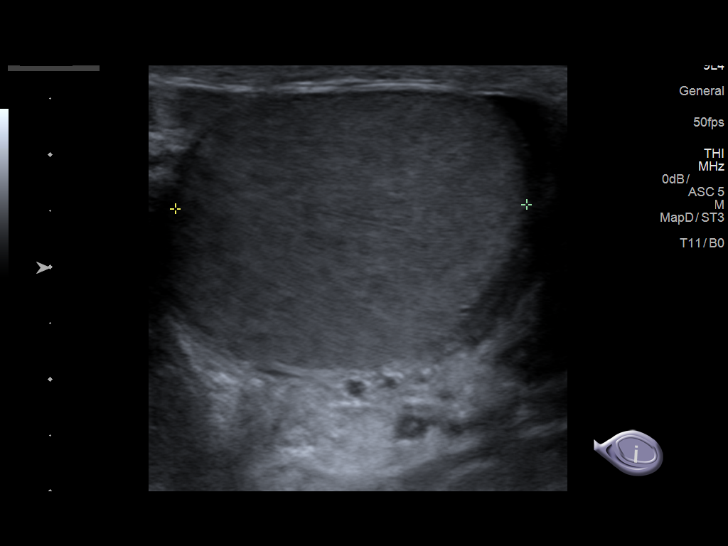
[im 16/41]
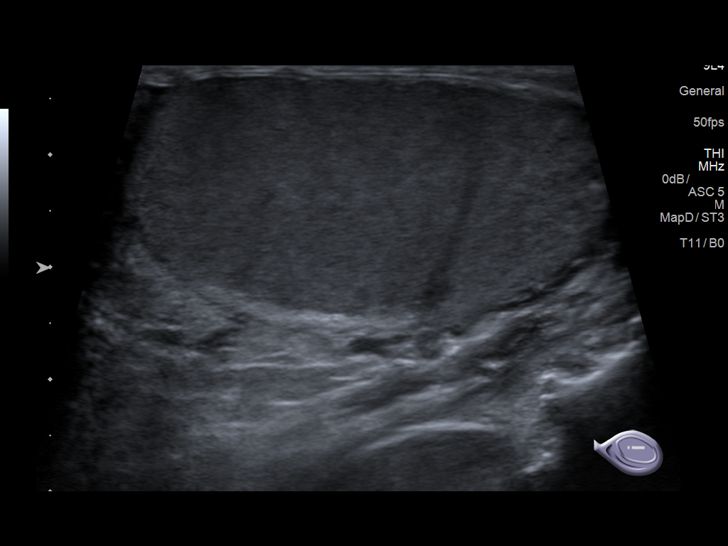
[im 19/41]
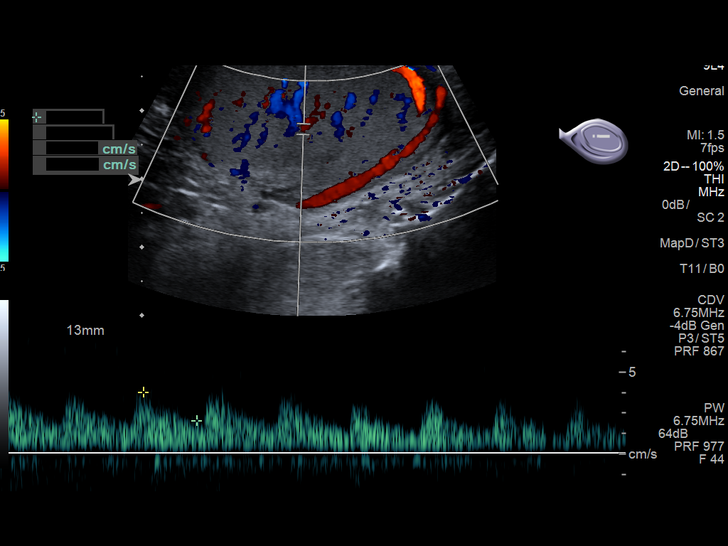
[im 22/41]
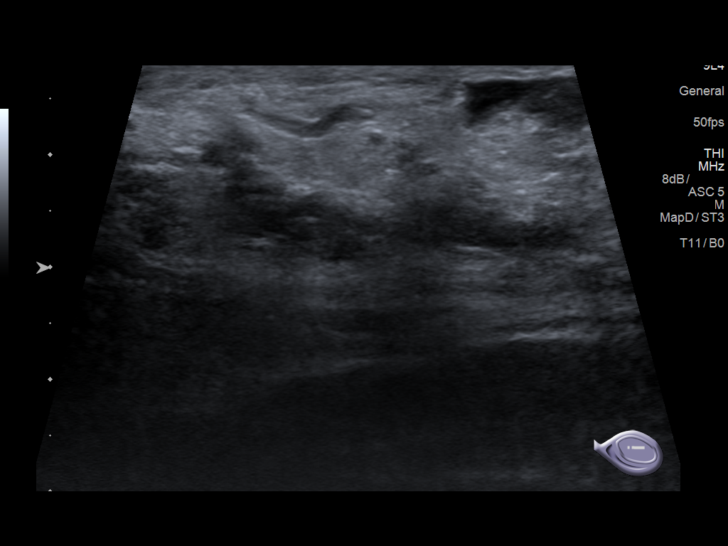
[im 26/41]
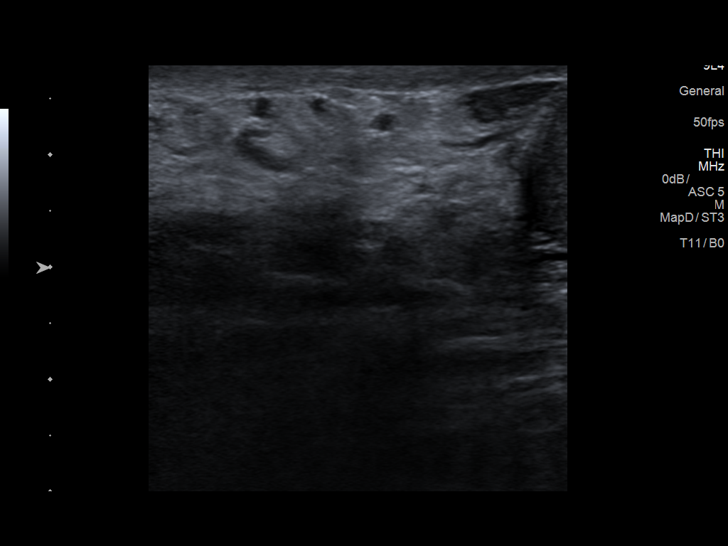
[im 27/41]
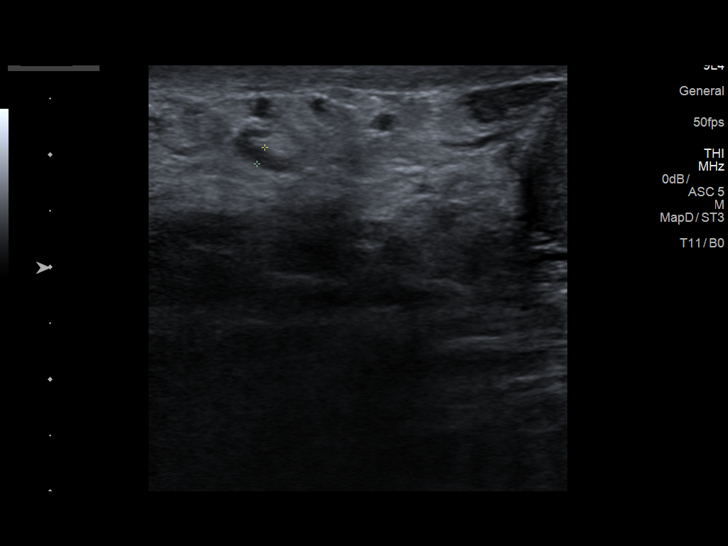
[im 31/41]
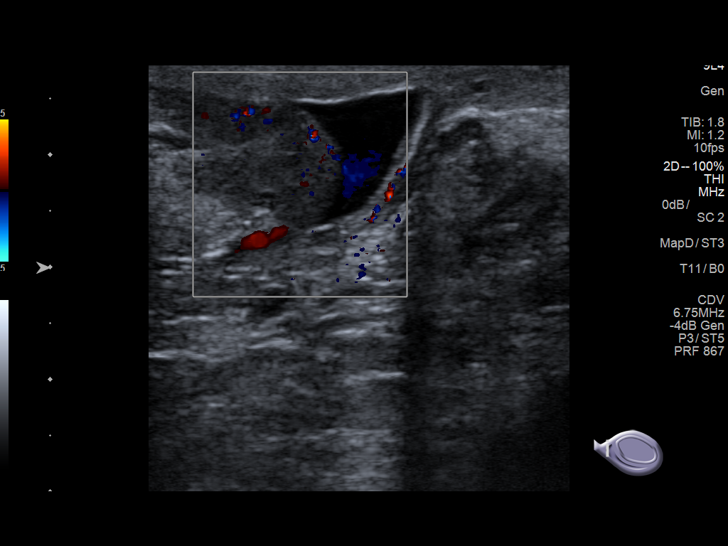
[im 34/41]
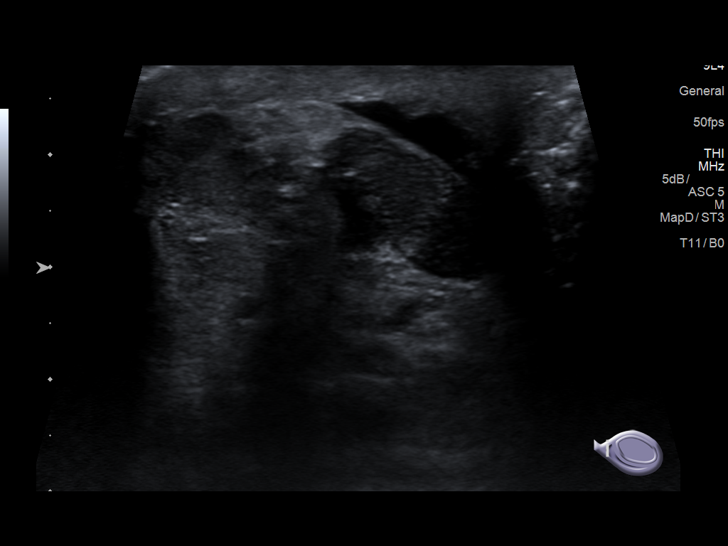
[im 37/41]
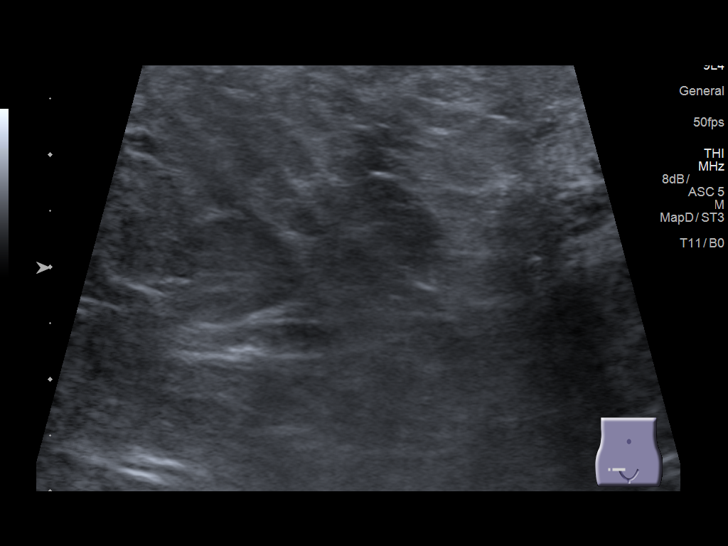
[im 41/41]
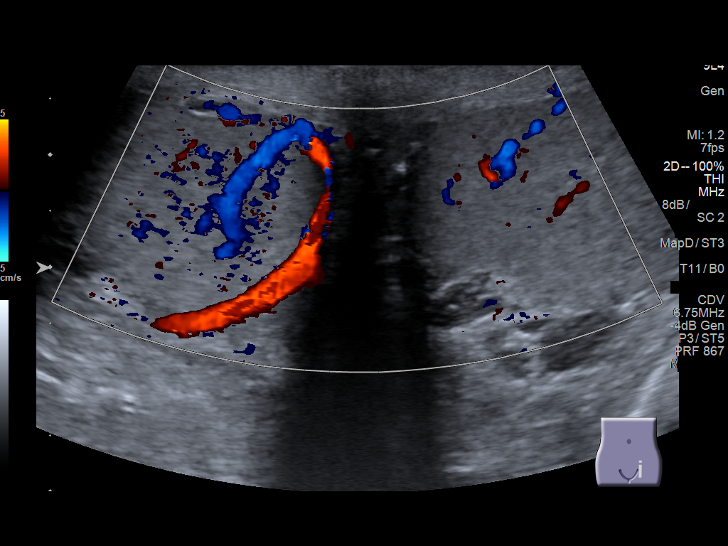

[14 of 25 positions shown; findings below may reference images not displayed]

FINDINGS: Right testicle

Measurements: 4.3 x 2.6 x 3.1 cm. No mass or microlithiasis
visualized.

Left testicle

Measurements: 4.2 x 2.2 x 3.1 cm. No mass or microlithiasis
visualized.

Right epididymis:  Normal in size and appearance.

Left epididymis:  Normal in size and appearance.

Hydrocele:  None visualized.

Varicocele:  None visualized.

Pulsed Doppler interrogation of both testes demonstrates normal low
resistance arterial and venous waveforms bilaterally.
IMPRESSION: Unremarkable testicular ultrasound.

## 2018-06-02 ENCOUNTER — Encounter: Payer: Self-pay | Admitting: Internal Medicine

## 2018-06-02 ENCOUNTER — Ambulatory Visit (INDEPENDENT_AMBULATORY_CARE_PROVIDER_SITE_OTHER): Payer: 59 | Admitting: Internal Medicine

## 2018-06-02 ENCOUNTER — Other Ambulatory Visit (INDEPENDENT_AMBULATORY_CARE_PROVIDER_SITE_OTHER): Payer: 59

## 2018-06-02 VITALS — BP 136/88 | HR 82 | Temp 98.5°F | Ht 70.5 in | Wt 317.0 lb

## 2018-06-02 DIAGNOSIS — Z23 Encounter for immunization: Secondary | ICD-10-CM | POA: Diagnosis not present

## 2018-06-02 DIAGNOSIS — Z0000 Encounter for general adult medical examination without abnormal findings: Secondary | ICD-10-CM

## 2018-06-02 LAB — CBC WITH DIFFERENTIAL/PLATELET
Basophils Absolute: 0.1 10*3/uL (ref 0.0–0.1)
Basophils Relative: 0.9 % (ref 0.0–3.0)
EOS PCT: 2.5 % (ref 0.0–5.0)
Eosinophils Absolute: 0.2 10*3/uL (ref 0.0–0.7)
HCT: 41.8 % (ref 39.0–52.0)
Hemoglobin: 14.5 g/dL (ref 13.0–17.0)
LYMPHS ABS: 1.7 10*3/uL (ref 0.7–4.0)
Lymphocytes Relative: 21 % (ref 12.0–46.0)
MCHC: 34.7 g/dL (ref 30.0–36.0)
MCV: 84.5 fl (ref 78.0–100.0)
MONO ABS: 0.6 10*3/uL (ref 0.1–1.0)
Monocytes Relative: 7.9 % (ref 3.0–12.0)
NEUTROS PCT: 67.7 % (ref 43.0–77.0)
Neutro Abs: 5.5 10*3/uL (ref 1.4–7.7)
Platelets: 267 10*3/uL (ref 150.0–400.0)
RBC: 4.94 Mil/uL (ref 4.22–5.81)
RDW: 13.3 % (ref 11.5–15.5)
WBC: 8.1 10*3/uL (ref 4.0–10.5)

## 2018-06-02 LAB — BASIC METABOLIC PANEL
BUN: 14 mg/dL (ref 6–23)
CHLORIDE: 105 meq/L (ref 96–112)
CO2: 28 mEq/L (ref 19–32)
Calcium: 9.2 mg/dL (ref 8.4–10.5)
Creatinine, Ser: 0.82 mg/dL (ref 0.40–1.50)
GFR: 111.11 mL/min (ref >60.00)
GLUCOSE: 100 mg/dL — AB (ref 70–99)
Potassium: 4 mEq/L (ref 3.5–5.1)
SODIUM: 140 meq/L (ref 135–145)

## 2018-06-02 LAB — LIPID PANEL
Cholesterol: 123 mg/dL (ref 0–200)
HDL: 38.6 mg/dL — AB (ref >39.00)
LDL Cholesterol: 72 mg/dL (ref 0–99)
NONHDL: 84.12
Total CHOL/HDL Ratio: 3
Triglycerides: 63 mg/dL (ref 0.0–149.0)
VLDL: 12.6 mg/dL (ref 0.0–40.0)

## 2018-06-02 LAB — HEPATIC FUNCTION PANEL
ALT: 36 U/L (ref 0–53)
AST: 23 U/L (ref 0–37)
Albumin: 4.3 g/dL (ref 3.5–5.2)
Alkaline Phosphatase: 93 U/L (ref 39–117)
BILIRUBIN TOTAL: 0.5 mg/dL (ref 0.2–1.2)
Bilirubin, Direct: 0.1 mg/dL (ref 0.0–0.3)
Total Protein: 7.1 g/dL (ref 6.0–8.3)

## 2018-06-02 LAB — URINALYSIS, ROUTINE W REFLEX MICROSCOPIC
Bilirubin Urine: NEGATIVE
Hgb urine dipstick: NEGATIVE
Ketones, ur: NEGATIVE
LEUKOCYTES UA: NEGATIVE
Nitrite: NEGATIVE
PH: 6 (ref 5.0–8.0)
RBC / HPF: NONE SEEN (ref 0–?)
Specific Gravity, Urine: 1.01 (ref 1.000–1.030)
TOTAL PROTEIN, URINE-UPE24: NEGATIVE
URINE GLUCOSE: NEGATIVE
Urobilinogen, UA: 0.2 (ref 0.0–1.0)
WBC, UA: NONE SEEN (ref 0–?)

## 2018-06-02 LAB — TSH: TSH: 1.95 u[IU]/mL (ref 0.35–4.50)

## 2018-06-02 NOTE — Progress Notes (Signed)
Subjective:    Patient ID: Carlos Boyd, male    DOB: 1981-03-05, 37 y.o.   MRN: [id number]  HPI    Here for wellness and f/u;  Overall doing ok;  Pt denies Chest pain, worsening SOB, DOE, wheezing, orthopnea, PND, worsening LE edema, palpitations, dizziness or syncope.  Pt denies neurological change such as new headache, facial or extremity weakness.  Pt denies polydipsia, polyuria, or low sugar symptoms. Pt states overall good compliance with treatment and medications, good tolerability, and has been trying to follow appropriate diet.  Pt denies worsening depressive symptoms, suicidal ideation or panic. No fever, night sweats, wt loss, loss of appetite, or other constitutional symptoms.  Pt states good ability with ADL's, has low fall risk, home safety reviewed and adequate, no other significant changes in hearing or vision, and only occasionally active with exercise.  No new complaints Past Medical History:  Diagnosis Date  . Morbid obesity (HCC) 06/02/2017   Past Surgical History:  Procedure Laterality Date  . right arthroscopy knee     [place], [place]  . WISDOM TOOTH EXTRACTION      reports that he has never smoked. He has never used smokeless tobacco. He reports that he drinks about 1.0 standard drinks of alcohol per week. He reports that he does not use drugs. family history includes Diabetes in his maternal grandfather; Healthy in his father and mother. No Known Allergies Current Outpatient Medications on File Prior to Visit  Medication Sig Dispense Refill  . clotrimazole-betamethasone (LOTRISONE) cream Apply 1 application 2 (two) times daily topically. 30 g 1   No current facility-administered medications on file prior to visit.    Review of Systems Constitutional: Negative for other unusual diaphoresis, sweats, appetite or weight changes HENT: Negative for other worsening hearing loss, ear pain, facial swelling, mouth sores or neck stiffness.   Eyes: Negative for other  worsening pain, redness or other visual disturbance.  Respiratory: Negative for other stridor or swelling Cardiovascular: Negative for other palpitations or other chest pain  Gastrointestinal: Negative for worsening diarrhea or loose stools, blood in stool, distention or other pain Genitourinary: Negative for hematuria, flank pain or other change in urine volume.  Musculoskeletal: Negative for myalgias or other joint swelling.  Skin: Negative for other color change, or other wound or worsening drainage.  Neurological: Negative for other syncope or numbness. Hematological: Negative for other adenopathy or swelling Psychiatric/Behavioral: Negative for hallucinations, other worsening agitation, SI, self-injury, or new decreased concentration All other system neg per pt    Objective:   Physical Exam BP 136/88   Pulse 82   Temp 98.5 F (36.9 C) (Oral)   Ht 5' 10.5" (1.791 m)   Wt (!) 317 lb (143.8 kg)   SpO2 93%   BMI 44.84 kg/m  VS noted,  Constitutional: Pt is oriented to person, place, and time. Appears well-developed and well-nourished, in no significant distress and comfortable Head: Normocephalic and atraumatic  Eyes: Conjunctivae and EOM are normal. Pupils are equal, round, and reactive to light Right Ear: External ear normal without discharge Left Ear: External ear normal without discharge Nose: Nose without discharge or deformity Mouth/Throat: Oropharynx is without other ulcerations and moist  Neck: Normal range of motion. Neck supple. No JVD present. No tracheal deviation present or significant neck LA or mass Cardiovascular: Normal rate, regular rhythm, normal heart sounds and intact distal pulses.   Pulmonary/Chest: WOB normal and breath sounds without rales or wheezing  Abdominal: Soft. Bowel sounds are normal. NT.  No HSM  Musculoskeletal: Normal range of motion. Exhibits no edema Lymphadenopathy: Has no other cervical adenopathy.  Neurological: Pt is alert and oriented  to person, place, and time. Pt has normal reflexes. No cranial nerve deficit. Motor grossly intact, Gait intact Skin: Skin is warm and dry. No rash noted or new ulcerations Psychiatric:  Has normal mood and affect. Behavior is normal without agitation No other exam findings Lab Results  Component Value Date   WBC 8.1 06/02/2018   HGB 14.5 06/02/2018   HCT 41.8 06/02/2018   PLT 267.0 06/02/2018   GLUCOSE 100 (H) 06/02/2018   CHOL 123 06/02/2018   TRIG 63.0 06/02/2018   HDL 38.60 (L) 06/02/2018   LDLCALC 72 06/02/2018   ALT 36 06/02/2018   AST 23 06/02/2018   NA 140 06/02/2018   K 4.0 06/02/2018   CL 105 06/02/2018   CREATININE 0.82 06/02/2018   BUN 14 06/02/2018   CO2 28 06/02/2018   TSH 1.95 06/02/2018       Assessment & Plan:

## 2018-06-02 NOTE — Patient Instructions (Addendum)

## 2018-06-02 NOTE — Assessment & Plan Note (Signed)

## 2018-10-27 ENCOUNTER — Other Ambulatory Visit: Payer: Self-pay | Admitting: Internal Medicine

## 2018-10-27 DIAGNOSIS — B356 Tinea cruris: Secondary | ICD-10-CM

## 2019-02-25 ENCOUNTER — Other Ambulatory Visit: Payer: Self-pay

## 2019-02-25 ENCOUNTER — Encounter: Payer: Self-pay | Admitting: Internal Medicine

## 2019-02-25 ENCOUNTER — Ambulatory Visit: Payer: Managed Care, Other (non HMO) | Admitting: Internal Medicine

## 2019-02-25 ENCOUNTER — Ambulatory Visit (INDEPENDENT_AMBULATORY_CARE_PROVIDER_SITE_OTHER): Payer: Managed Care, Other (non HMO) | Admitting: Internal Medicine

## 2019-02-25 DIAGNOSIS — Z20822 Contact with and (suspected) exposure to covid-19: Secondary | ICD-10-CM

## 2019-02-25 DIAGNOSIS — Z20828 Contact with and (suspected) exposure to other viral communicable diseases: Secondary | ICD-10-CM | POA: Diagnosis not present

## 2019-02-25 DIAGNOSIS — J069 Acute upper respiratory infection, unspecified: Secondary | ICD-10-CM | POA: Diagnosis not present

## 2019-02-25 DIAGNOSIS — M545 Low back pain, unspecified: Secondary | ICD-10-CM

## 2019-02-25 MED ORDER — GUAIFENESIN ER 600 MG PO TB12: 1200.0000 mg | ORAL_TABLET | Freq: Two times a day (BID) | ORAL | 0 refills | Status: DC | PRN

## 2019-02-25 MED ORDER — HYDROCODONE-HOMATROPINE 5-1.5 MG/5ML PO SYRP: 5.0000 mL | ORAL_SOLUTION | Freq: Four times a day (QID) | ORAL | 0 refills | Status: AC | PRN

## 2019-02-25 MED ORDER — CYCLOBENZAPRINE HCL 5 MG PO TABS: 5.0000 mg | ORAL_TABLET | Freq: Three times a day (TID) | ORAL | 0 refills | Status: DC | PRN

## 2019-02-25 MED ORDER — AZITHROMYCIN 250 MG PO TABS: ORAL_TABLET | ORAL | 1 refills | Status: DC

## 2019-02-25 NOTE — Assessment & Plan Note (Addendum)
Etiology unclear with differential including viral vs bacterial, will tx empirix zpack, mucinex, cough med prn, but also refer COVID testing
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
## 2019-02-25 NOTE — Assessment & Plan Note (Signed)
Also for covid testing referral

## 2019-02-25 NOTE — Addendum Note (Signed)
Addended by: Biagio Borg on: 02/25/2019 12:05 PM   Modules accepted: Level of Service

## 2019-02-25 NOTE — Patient Instructions (Signed)
Please take all new medication as prescribed - the antibiotic, cough medicine, mucinex, and muscle relaxer as needed  Please go to the [facility] testing site for COVID testing  Please continue all other medications as before, and refills have been done if requested.  Please have the pharmacy call with any other refills you may need  Please keep your appointments with your specialists as you may have planned

## 2019-02-27 ENCOUNTER — Telehealth: Payer: Self-pay

## 2019-02-27 LAB — NOVEL CORONAVIRUS, NAA: SARS-CoV-2, NAA: NOT DETECTED

## 2019-02-27 NOTE — Telephone Encounter (Signed)
Pt has viewed results via MyChart  

## 2019-02-27 NOTE — Telephone Encounter (Signed)
-----   Message from Biagio Borg, MD sent at 02/27/2019 12:21 PM EDT ----- Left message on MyChart, pt to cont same tx except  The test results show that your current treatment is OK, as the covid test is neg.   Jester Klingberg to please inform pt

## 2019-06-04 ENCOUNTER — Other Ambulatory Visit: Payer: Self-pay | Admitting: Internal Medicine

## 2019-06-04 ENCOUNTER — Other Ambulatory Visit: Payer: Managed Care, Other (non HMO)

## 2019-06-04 ENCOUNTER — Ambulatory Visit (INDEPENDENT_AMBULATORY_CARE_PROVIDER_SITE_OTHER): Payer: Managed Care, Other (non HMO) | Admitting: Internal Medicine

## 2019-06-04 ENCOUNTER — Encounter: Payer: Self-pay | Admitting: Internal Medicine

## 2019-06-04 ENCOUNTER — Other Ambulatory Visit: Payer: Self-pay

## 2019-06-04 ENCOUNTER — Other Ambulatory Visit (INDEPENDENT_AMBULATORY_CARE_PROVIDER_SITE_OTHER): Payer: Managed Care, Other (non HMO)

## 2019-06-04 VITALS — BP 142/88 | HR 88 | Temp 98.2°F | Ht 70.5 in | Wt 243.0 lb

## 2019-06-04 DIAGNOSIS — Z6841 Body Mass Index (BMI) 40.0 and over, adult: Secondary | ICD-10-CM

## 2019-06-04 DIAGNOSIS — R030 Elevated blood-pressure reading, without diagnosis of hypertension: Secondary | ICD-10-CM | POA: Diagnosis not present

## 2019-06-04 DIAGNOSIS — Z23 Encounter for immunization: Secondary | ICD-10-CM | POA: Diagnosis not present

## 2019-06-04 DIAGNOSIS — Z0000 Encounter for general adult medical examination without abnormal findings: Secondary | ICD-10-CM | POA: Diagnosis not present

## 2019-06-04 DIAGNOSIS — E611 Iron deficiency: Secondary | ICD-10-CM

## 2019-06-04 DIAGNOSIS — E559 Vitamin D deficiency, unspecified: Secondary | ICD-10-CM | POA: Diagnosis not present

## 2019-06-04 DIAGNOSIS — R739 Hyperglycemia, unspecified: Secondary | ICD-10-CM

## 2019-06-04 DIAGNOSIS — E538 Deficiency of other specified B group vitamins: Secondary | ICD-10-CM

## 2019-06-04 LAB — URINALYSIS, ROUTINE W REFLEX MICROSCOPIC
Bilirubin Urine: NEGATIVE
Hgb urine dipstick: NEGATIVE
Ketones, ur: NEGATIVE
Leukocytes,Ua: NEGATIVE
Nitrite: NEGATIVE
RBC / HPF: NONE SEEN (ref 0–?)
Specific Gravity, Urine: 1.02 (ref 1.000–1.030)
Total Protein, Urine: NEGATIVE
Urine Glucose: NEGATIVE
Urobilinogen, UA: 0.2 (ref 0.0–1.0)
pH: 7 (ref 5.0–8.0)

## 2019-06-04 LAB — CBC WITH DIFFERENTIAL/PLATELET
Basophils Absolute: 0.1 10*3/uL (ref 0.0–0.1)
Basophils Relative: 1.9 % (ref 0.0–3.0)
Eosinophils Absolute: 0.2 10*3/uL (ref 0.0–0.7)
Eosinophils Relative: 3.5 % (ref 0.0–5.0)
HCT: 43.5 % (ref 39.0–52.0)
Hemoglobin: 14.9 g/dL (ref 13.0–17.0)
Lymphocytes Relative: 23 % (ref 12.0–46.0)
Lymphs Abs: 1.4 10*3/uL (ref 0.7–4.0)
MCHC: 34.3 g/dL (ref 30.0–36.0)
MCV: 88.3 fl (ref 78.0–100.0)
Monocytes Absolute: 0.5 10*3/uL (ref 0.1–1.0)
Monocytes Relative: 8 % (ref 3.0–12.0)
Neutro Abs: 3.8 10*3/uL (ref 1.4–7.7)
Neutrophils Relative %: 63.6 % (ref 43.0–77.0)
Platelets: 275 10*3/uL (ref 150.0–400.0)
RBC: 4.93 Mil/uL (ref 4.22–5.81)
RDW: 12.4 % (ref 11.5–15.5)
WBC: 6 10*3/uL (ref 4.0–10.5)

## 2019-06-04 LAB — BASIC METABOLIC PANEL
BUN: 12 mg/dL (ref 6–23)
CO2: 30 mEq/L (ref 19–32)
Calcium: 9.4 mg/dL (ref 8.4–10.5)
Chloride: 106 mEq/L (ref 96–112)
Creatinine, Ser: 0.88 mg/dL (ref 0.40–1.50)
GFR: 95.86 mL/min (ref >60.00)
Glucose, Bld: 98 mg/dL (ref 70–99)
Potassium: 4.8 mEq/L (ref 3.5–5.1)
Sodium: 142 mEq/L (ref 135–145)

## 2019-06-04 LAB — LIPID PANEL
Cholesterol: 147 mg/dL (ref 0–200)
HDL: 55.4 mg/dL (ref >39.00)
LDL Cholesterol: 83 mg/dL (ref 0–99)
NonHDL: 91.25
Total CHOL/HDL Ratio: 3
Triglycerides: 41 mg/dL (ref 0.0–149.0)
VLDL: 8.2 mg/dL (ref 0.0–40.0)

## 2019-06-04 LAB — PSA: PSA: 0.5 ng/mL (ref 0.10–4.00)

## 2019-06-04 LAB — IBC PANEL
Iron: 73 ug/dL (ref 42–165)
Saturation Ratios: 18.7 % — ABNORMAL LOW (ref 20.0–50.0)
Transferrin: 279 mg/dL (ref 212.0–360.0)

## 2019-06-04 LAB — TSH: TSH: 1.89 u[IU]/mL (ref 0.35–4.50)

## 2019-06-04 LAB — HEPATIC FUNCTION PANEL
ALT: 15 U/L (ref 0–53)
AST: 14 U/L (ref 0–37)
Albumin: 4.6 g/dL (ref 3.5–5.2)
Alkaline Phosphatase: 69 U/L (ref 39–117)
Bilirubin, Direct: 0.1 mg/dL (ref 0.0–0.3)
Total Bilirubin: 0.5 mg/dL (ref 0.2–1.2)
Total Protein: 7.1 g/dL (ref 6.0–8.3)

## 2019-06-04 LAB — HEMOGLOBIN A1C: Hgb A1c MFr Bld: 4.9 % (ref 4.6–6.5)

## 2019-06-04 LAB — VITAMIN B12: Vitamin B-12: 219 pg/mL (ref 211–911)

## 2019-06-04 LAB — VITAMIN D 25 HYDROXY (VIT D DEFICIENCY, FRACTURES): VITD: 20.12 ng/mL — ABNORMAL LOW (ref 30.00–100.00)

## 2019-06-04 MED ORDER — VITAMIN D (ERGOCALCIFEROL) 1.25 MG (50000 UNIT) PO CAPS: 50000.0000 [IU] | ORAL_CAPSULE | ORAL | 0 refills | Status: DC

## 2019-06-04 NOTE — Progress Notes (Signed)
Subjective:    Patient ID: Carlos Boyd, male    DOB: [birth year], 38 y.o.   MRN: [id number]  HPI  Here for wellness and f/u;  Overall doing ok;  Pt denies Chest pain, worsening SOB, DOE, wheezing, orthopnea, PND, worsening LE edema, palpitations, dizziness or syncope.  Pt denies neurological change such as new headache, facial or extremity weakness.  Pt denies polydipsia, polyuria, or low sugar symptoms. Pt states overall good compliance with treatment and medications, good tolerability, and has been trying to follow appropriate diet.  Pt denies worsening depressive symptoms, suicidal ideation or panic. No fever, night sweats, wt loss, loss of appetite, or other constitutional symptoms.  Pt states good ability with ADL's, has low fall risk, home safety reviewed and adequate, no other significant changes in hearing or vision, and only occasionally active with exercise. Wt Readings from Last 3 Encounters:  06/04/19 243 lb (110.2 kg)  06/02/18 (!) 317 lb (143.8 kg)  05/31/17 (!) 310 lb (140.6 kg)  No new complaints, lost wt with better diet Past Medical History:  Diagnosis Date  . Morbid obesity (HCC) 06/02/2017   Past Surgical History:  Procedure Laterality Date  . right arthroscopy knee     [place], [place]  . WISDOM TOOTH EXTRACTION      reports that he has never smoked. He has never used smokeless tobacco. He reports current alcohol use of about 1.0 standard drinks of alcohol per week. He reports that he does not use drugs. family history includes Diabetes in his maternal grandfather; Healthy in his father and mother. No Known Allergies Current Outpatient Medications on File Prior to Visit  Medication Sig Dispense Refill  . azithromycin (ZITHROMAX) 250 MG tablet 2 tab by mouth day 1, then 1 per day 6 tablet 1  . clotrimazole-betamethasone (LOTRISONE) cream APPLY EXTERNALLY TO THE AFFECTED AREA TWICE DAILY 30 g 1  . cyclobenzaprine (FLEXERIL) 5 MG tablet Take 1 tablet (5 mg total) by  mouth 3 (three) times daily as needed for muscle spasms. 60 tablet 0  . guaiFENesin (MUCINEX) 600 MG 12 hr tablet Take 2 tablets (1,200 mg total) by mouth 2 (two) times daily as needed. 60 tablet 0   No current facility-administered medications on file prior to visit.    Review of Systems  Constitutional: Negative for other unusual diaphoresis or sweats HENT: Negative for ear discharge or swelling Eyes: Negative for other worsening visual disturbances Respiratory: Negative for stridor or other swelling  Gastrointestinal: Negative for worsening distension or other blood Genitourinary: Negative for retention or other urinary change Musculoskeletal: Negative for other MSK pain or swelling Skin: Negative for color change or other new lesions Neurological: Negative for worsening tremors and other numbness  Psychiatric/Behavioral: Negative for worsening agitation or other fatigue All otherwise neg per pt     Objective:   Physical Exam BP (!) 142/88   Pulse 88   Temp 98.2 F (36.8 C) (Oral)   Ht 5' 10.5" (1.791 m)   Wt 243 lb (110.2 kg)   SpO2 96%   BMI 34.37 kg/m  VS noted,  Constitutional: Pt appears in NAD HENT: Head: NCAT.  Right Ear: External ear normal.  Left Ear: External ear normal.  Eyes: . Pupils are equal, round, and reactive to light. Conjunctivae and EOM are normal Nose: without d/c or deformity Neck: Neck supple. Gross normal ROM Cardiovascular: Normal rate and regular rhythm.   Pulmonary/Chest: Effort normal and breath sounds without rales or wheezing.  Abd:  Soft, NT,  ND, + BS, no organomegaly Neurological: Pt is alert. At baseline orientation, motor grossly intact Skin: Skin is warm. No rashes, other new lesions, no LE edema Psychiatric: Pt behavior is normal without agitation  All otherwise neg per pt Lab Results  Component Value Date   WBC 6.0 06/04/2019   HGB 14.9 06/04/2019   HCT 43.5 06/04/2019   PLT 275.0 06/04/2019   GLUCOSE 98 06/04/2019   CHOL  147 06/04/2019   TRIG 41.0 06/04/2019   HDL 55.40 06/04/2019   LDLCALC 83 06/04/2019   ALT 15 06/04/2019   AST 14 06/04/2019   NA 142 06/04/2019   K 4.8 06/04/2019   CL 106 06/04/2019   CREATININE 0.88 06/04/2019   BUN 12 06/04/2019   CO2 30 06/04/2019   TSH 1.89 06/04/2019   PSA 0.50 06/04/2019   HGBA1C 4.9 06/04/2019      Assessment & Plan:

## 2019-06-04 NOTE — Assessment & Plan Note (Signed)
Cont diet and wt loss

## 2019-06-04 NOTE — Assessment & Plan Note (Signed)
For a1c with labs,  to f/u any worsening symptoms or concerns 

## 2019-06-04 NOTE — Assessment & Plan Note (Signed)
BP at home < 140/90 per pt, cont to follow with wt los

## 2019-06-04 NOTE — Patient Instructions (Addendum)

## 2019-06-04 NOTE — Assessment & Plan Note (Signed)

## 2020-04-05 ENCOUNTER — Encounter: Payer: Self-pay | Admitting: Internal Medicine

## 2020-04-05 MED ORDER — AMOXICILLIN 500 MG PO CAPS: 1000.0000 mg | ORAL_CAPSULE | Freq: Two times a day (BID) | ORAL | 0 refills | Status: DC

## 2020-06-06 ENCOUNTER — Encounter: Payer: Self-pay | Admitting: Internal Medicine

## 2020-06-06 ENCOUNTER — Other Ambulatory Visit: Payer: Self-pay

## 2020-06-06 ENCOUNTER — Ambulatory Visit (INDEPENDENT_AMBULATORY_CARE_PROVIDER_SITE_OTHER): Payer: BC Managed Care – PPO | Admitting: Internal Medicine

## 2020-06-06 VITALS — BP 138/96 | HR 76 | Temp 98.2°F | Ht 70.5 in | Wt 277.0 lb

## 2020-06-06 DIAGNOSIS — R030 Elevated blood-pressure reading, without diagnosis of hypertension: Secondary | ICD-10-CM

## 2020-06-06 DIAGNOSIS — E538 Deficiency of other specified B group vitamins: Secondary | ICD-10-CM | POA: Diagnosis not present

## 2020-06-06 DIAGNOSIS — Z0000 Encounter for general adult medical examination without abnormal findings: Secondary | ICD-10-CM

## 2020-06-06 DIAGNOSIS — E559 Vitamin D deficiency, unspecified: Secondary | ICD-10-CM

## 2020-06-06 DIAGNOSIS — Z23 Encounter for immunization: Secondary | ICD-10-CM | POA: Diagnosis not present

## 2020-06-06 DIAGNOSIS — R739 Hyperglycemia, unspecified: Secondary | ICD-10-CM | POA: Diagnosis not present

## 2020-06-06 DIAGNOSIS — Z1159 Encounter for screening for other viral diseases: Secondary | ICD-10-CM

## 2020-06-06 LAB — CBC WITH DIFFERENTIAL/PLATELET
Basophils Absolute: 0.1 10*3/uL (ref 0.0–0.1)
Basophils Relative: 1 % (ref 0.0–3.0)
Eosinophils Absolute: 0.2 10*3/uL (ref 0.0–0.7)
Eosinophils Relative: 2.4 % (ref 0.0–5.0)
HCT: 41.9 % (ref 39.0–52.0)
Hemoglobin: 14.4 g/dL (ref 13.0–17.0)
Lymphocytes Relative: 21.9 % (ref 12.0–46.0)
Lymphs Abs: 1.5 10*3/uL (ref 0.7–4.0)
MCHC: 34.5 g/dL (ref 30.0–36.0)
MCV: 87.6 fl (ref 78.0–100.0)
Monocytes Absolute: 0.5 10*3/uL (ref 0.1–1.0)
Monocytes Relative: 7 % (ref 3.0–12.0)
Neutro Abs: 4.7 10*3/uL (ref 1.4–7.7)
Neutrophils Relative %: 67.7 % (ref 43.0–77.0)
Platelets: 256 10*3/uL (ref 150.0–400.0)
RBC: 4.78 Mil/uL (ref 4.22–5.81)
RDW: 12.8 % (ref 11.5–15.5)
WBC: 7 10*3/uL (ref 4.0–10.5)

## 2020-06-06 LAB — BASIC METABOLIC PANEL
BUN: 13 mg/dL (ref 6–23)
CO2: 30 mEq/L (ref 19–32)
Calcium: 9.1 mg/dL (ref 8.4–10.5)
Chloride: 104 mEq/L (ref 96–112)
Creatinine, Ser: 0.82 mg/dL (ref 0.40–1.50)
GFR: 109.41 mL/min (ref >60.00)
Glucose, Bld: 82 mg/dL (ref 70–99)
Potassium: 3.9 mEq/L (ref 3.5–5.1)
Sodium: 140 mEq/L (ref 135–145)

## 2020-06-06 LAB — URINALYSIS, ROUTINE W REFLEX MICROSCOPIC
Bilirubin Urine: NEGATIVE
Hgb urine dipstick: NEGATIVE
Ketones, ur: NEGATIVE
Leukocytes,Ua: NEGATIVE
Nitrite: NEGATIVE
Specific Gravity, Urine: 1.02 (ref 1.000–1.030)
Total Protein, Urine: NEGATIVE
Urine Glucose: NEGATIVE
Urobilinogen, UA: 0.2 (ref 0.0–1.0)
pH: 6.5 (ref 5.0–8.0)

## 2020-06-06 LAB — LIPID PANEL
Cholesterol: 129 mg/dL (ref 0–200)
HDL: 59.1 mg/dL (ref >39.00)
LDL Cholesterol: 61 mg/dL (ref 0–99)
NonHDL: 69.74
Total CHOL/HDL Ratio: 2
Triglycerides: 46 mg/dL (ref 0.0–149.0)
VLDL: 9.2 mg/dL (ref 0.0–40.0)

## 2020-06-06 LAB — VITAMIN B12: Vitamin B-12: 229 pg/mL (ref 211–911)

## 2020-06-06 LAB — HEPATIC FUNCTION PANEL
ALT: 24 U/L (ref 0–53)
AST: 21 U/L (ref 0–37)
Albumin: 4.5 g/dL (ref 3.5–5.2)
Alkaline Phosphatase: 67 U/L (ref 39–117)
Bilirubin, Direct: 0.1 mg/dL (ref 0.0–0.3)
Total Bilirubin: 0.7 mg/dL (ref 0.2–1.2)
Total Protein: 7.2 g/dL (ref 6.0–8.3)

## 2020-06-06 LAB — PSA: PSA: 0.33 ng/mL (ref 0.10–4.00)

## 2020-06-06 LAB — TSH: TSH: 1.51 u[IU]/mL (ref 0.35–4.50)

## 2020-06-06 LAB — VITAMIN D 25 HYDROXY (VIT D DEFICIENCY, FRACTURES): VITD: 23.66 ng/mL — ABNORMAL LOW (ref 30.00–100.00)

## 2020-06-06 LAB — HEMOGLOBIN A1C: Hgb A1c MFr Bld: 5.1 % (ref 4.6–6.5)

## 2020-06-06 NOTE — Patient Instructions (Addendum)
Please consider having your covid booster shot done soon  You had the flu shot today  Please check your BP at home once daily for 10 days and call (or do the [place]) with the average  OK to start Vit D3 at 2000 units per day, and just keep taking  Please continue all other medications as before, and refills have been done if requested.  Please have the pharmacy call with any other refills you may need.  Please continue your efforts at being more active, low cholesterol diet, and weight control.  You are otherwise up to date with prevention measures today.  Please keep your appointments with your specialists as you may have planned  Please go to the LAB at the blood drawing area for the tests to be done  You will be contacted by phone if any changes need to be made immediately.  Otherwise, you will receive a letter about your results with an explanation, but please check with MyChart first.  Please remember to sign up for MyChart if you have not done so, as this will be important to you in the future with finding out test results, communicating by private email, and scheduling acute appointments online when needed.  Please make an Appointment to return for your 1 year visit, or sooner if needed

## 2020-06-06 NOTE — Progress Notes (Signed)
Subjective:    Patient ID: Carlos Boyd, male    DOB: May 09, 1981, 39 y.o.   MRN: [id number]  HPI  Here for wellness and f/u;  Overall doing ok;  Pt denies Chest pain, worsening SOB, DOE, wheezing, orthopnea, PND, worsening LE edema, palpitations, dizziness or syncope.  Pt denies neurological change such as new headache, facial or extremity weakness.  Pt denies polydipsia, polyuria, or low sugar symptoms. Pt states overall good compliance with treatment and medications, good tolerability, and has been trying to follow appropriate diet.  Pt denies worsening depressive symptoms, suicidal ideation or panic. No fever, night sweats, wt loss, loss of appetite, or other constitutional symptoms.  Pt states good ability with ADL's, has low fall risk, home safety reviewed and adequate, no other significant changes in hearing or vision, and only occasionally active with exercise.  No new complaints Past Medical History:  Diagnosis Date  . Morbid obesity (HCC) 06/02/2017   Past Surgical History:  Procedure Laterality Date  . right arthroscopy knee     [place], [place]  . WISDOM TOOTH EXTRACTION      reports that he has never smoked. He has never used smokeless tobacco. He reports current alcohol use of about 1.0 standard drink of alcohol per week. He reports that he does not use drugs. family history includes Diabetes in his maternal grandfather; Healthy in his father and mother. No Known Allergies Current Outpatient Medications on File Prior to Visit  Medication Sig Dispense Refill  . clotrimazole-betamethasone (LOTRISONE) cream APPLY EXTERNALLY TO THE AFFECTED AREA TWICE DAILY 30 g 1  . cyclobenzaprine (FLEXERIL) 5 MG tablet Take 1 tablet (5 mg total) by mouth 3 (three) times daily as needed for muscle spasms. 60 tablet 0  . guaiFENesin (MUCINEX) 600 MG 12 hr tablet Take 2 tablets (1,200 mg total) by mouth 2 (two) times daily as needed. 60 tablet 0   No current facility-administered medications on  file prior to visit.   Review of Systems All otherwise neg per pt    Objective:   Physical Exam BP (!) 138/96 (BP Location: Left Arm, Patient Position: Sitting, Cuff Size: Large)   Pulse 76   Temp 98.2 F (36.8 C) (Oral)   Ht 5' 10.5" (1.791 m)   Wt 277 lb (125.6 kg)   SpO2 96%   BMI 39.18 kg/m  VS noted,  Constitutional: Pt appears in NAD HENT: Head: NCAT.  Right Ear: External ear normal.  Left Ear: External ear normal.  Eyes: . Pupils are equal, round, and reactive to light. Conjunctivae and EOM are normal Nose: without d/c or deformity Neck: Neck supple. Gross normal ROM Cardiovascular: Normal rate and regular rhythm.   Pulmonary/Chest: Effort normal and breath sounds without rales or wheezing.  Abd:  Soft, NT, ND, + BS, no organomegaly Neurological: Pt is alert. At baseline orientation, motor grossly intact Skin: Skin is warm. No rashes, other new lesions, no LE edema Psychiatric: Pt behavior is normal without agitation  All otherwise neg per pt Lab Results  Component Value Date   WBC 7.0 06/06/2020   HGB 14.4 06/06/2020   HCT 41.9 06/06/2020   PLT 256.0 06/06/2020   GLUCOSE 82 06/06/2020   CHOL 129 06/06/2020   TRIG 46.0 06/06/2020   HDL 59.10 06/06/2020   LDLCALC 61 06/06/2020   ALT 24 06/06/2020   AST 21 06/06/2020   NA 140 06/06/2020   K 3.9 06/06/2020   CL 104 06/06/2020   CREATININE 0.82 06/06/2020   BUN  13 06/06/2020   CO2 30 06/06/2020   TSH 1.51 06/06/2020   PSA 0.33 06/06/2020   HGBA1C 5.1 06/06/2020      Assessment & Plan:

## 2020-06-07 LAB — HEPATITIS C ANTIBODY
Hepatitis C Ab: NONREACTIVE
SIGNAL TO CUT-OFF: 0.02 (ref <1.00)

## 2020-06-08 ENCOUNTER — Encounter: Payer: Self-pay | Admitting: Internal Medicine

## 2020-06-12 ENCOUNTER — Encounter: Payer: Self-pay | Admitting: Internal Medicine

## 2020-06-12 DIAGNOSIS — E559 Vitamin D deficiency, unspecified: Secondary | ICD-10-CM | POA: Insufficient documentation

## 2020-06-12 NOTE — Assessment & Plan Note (Signed)
stable overall by history and exam, recent data reviewed with pt, and pt to continue medical treatment as before,  to f/u any worsening symptoms or concerns  

## 2020-06-12 NOTE — Assessment & Plan Note (Signed)
To continue to monitor BP at home and call in 10 days for average > 140/90

## 2020-06-12 NOTE — Assessment & Plan Note (Signed)
To start vit d 2000 qd 

## 2020-06-12 NOTE — Assessment & Plan Note (Signed)

## 2020-09-05 ENCOUNTER — Encounter: Payer: Self-pay | Admitting: Internal Medicine

## 2020-10-03 ENCOUNTER — Telehealth (INDEPENDENT_AMBULATORY_CARE_PROVIDER_SITE_OTHER): Payer: 59 | Admitting: Family

## 2020-10-03 DIAGNOSIS — R197 Diarrhea, unspecified: Secondary | ICD-10-CM | POA: Diagnosis not present

## 2020-10-03 NOTE — Progress Notes (Signed)
Carlos Boyd is a 40 y.o. male with the following history as recorded in [employer]:  Patient Active Problem List   Diagnosis Date Noted  . Vitamin D deficiency 06/12/2020  . Hyperglycemia 06/04/2019  . Exposure to COVID-19 virus 02/25/2019  . Low back pain 02/25/2019  . Morbid obesity ([facility]) 06/02/2017  . Tinea cruris 02/01/2016  . Testicular pain, right 02/01/2016  . Routine general medical examination at a health care facility 11/29/2014  . Rash and nonspecific skin eruption 11/29/2014  . Elevated blood pressure reading without diagnosis of hypertension 11/04/2014  . Morbid obesity with BMI of 40.0-44.9, adult ([place]) 11/04/2014    Current Outpatient Medications  Medication Sig Dispense Refill  . clotrimazole-betamethasone (LOTRISONE) cream APPLY EXTERNALLY TO THE AFFECTED AREA TWICE DAILY 30 g 1  . cyclobenzaprine (FLEXERIL) 5 MG tablet Take 1 tablet (5 mg total) by mouth 3 (three) times daily as needed for muscle spasms. 60 tablet 0  . guaiFENesin (MUCINEX) 600 MG 12 hr tablet Take 2 tablets (1,200 mg total) by mouth 2 (two) times daily as needed. 60 tablet 0   No current facility-administered medications for this visit.    Allergies: Patient has no known allergies.  Past Medical History:  Diagnosis Date  . Morbid obesity ([place]) 06/02/2017    Past Surgical History:  Procedure Laterality Date  . right arthroscopy knee     [place], [place]  . WISDOM TOOTH EXTRACTION      Family History  Problem Relation Age of Onset  . Healthy Mother   . Healthy Father   . Diabetes Maternal Grandfather     Social History   Tobacco Use  . Smoking status: Never Smoker  . Smokeless tobacco: Never Used  Substance Use Topics  . Alcohol use: Yes    Alcohol/week: 1.0 standard drink    Types: 1 Cans of beer per week    Subjective:   I connected with Carlos Boyd on 10/03/20 at 10:00 AM EDT by a video enabled telemedicine application and verified that I am speaking with the correct person  using two identifiers.   I discussed the limitations of evaluation and management by telemedicine and the availability of in person appointments. The patient expressed understanding and agreed to proceed. Provider in office/ patient is at home; provider and patient are only 2 people on video call.   Diarrhea started last week ( last Sunday) and seemed to have resolve by Wednesday/ Thursday; symptoms have since re-flared; no recent travel, no abdominal pain, no recent antibiotics; no fever; able to go to work;  Has been continuing to drink water and coffee; some relief with OTC Pepto Bismol;     Objective:  There were no vitals filed for this visit.  General: Well developed, well nourished, in no acute distress  Head: Normocephalic and atraumatic  Lungs: Respirations unlabored;  Neurologic: Alert and oriented; speech intact; face symmetrical;   Assessment:  1. Diarrhea, unspecified type     Plan:  Recommended to try BRAT diet/ Gatorade intake; if no relief in 24-48 hours, he will go to [facility] for labs/ stool culture; follow-up to be determined;    No follow-ups on file.  Orders Placed This Encounter  Procedures  . CBC with Differential/Platelet    Standing Status:   Future    Standing Expiration Date:   10/03/2021  . Comp Met (CMET)    Standing Status:   Future    Standing Expiration Date:   10/03/2021  . Amylase    Standing  Status:   Future    Standing Expiration Date:   10/03/2021  . Lipase    Standing Status:   Future    Standing Expiration Date:   10/03/2021  . Gastrointestinal Pathogen Panel PCR    Standing Status:   Future    Standing Expiration Date:   10/03/2021    Requested Prescriptions    No prescriptions requested or ordered in this encounter

## 2020-10-03 NOTE — Patient Instructions (Signed)
Food Choices to Help Relieve Diarrhea, Adult Diarrhea can make you feel weak and cause you to become dehydrated. It is important to choose the right foods and drinks to:  Relieve diarrhea.  Replace lost fluids and nutrients.  Prevent dehydration. What are tips for following this plan? Relieving diarrhea  Avoid foods that make your diarrhea worse. These may include: ? Foods and beverages sweetened with high-fructose corn syrup, honey, or sweeteners such as xylitol, sorbitol, and mannitol. ? Fried, greasy, or spicy foods. ? Raw fruits and vegetables.  Eat foods that are rich in probiotics. These include foods such as yogurt and fermented milk products. Probiotics can help increase healthy bacteria in your stomach and intestines (gastrointestinal tract or GI tract). This may help digestion and stop diarrhea.  If you have lactose intolerance, avoid dairy products. These may make your diarrhea worse.  Take medicine to help stop diarrhea only as told by your health care provider. Replacing nutrients  Eat bland, easy-to-digest foods in small amounts as you are able, until your diarrhea starts to get better. These foods include bananas, applesauce, rice, toast, and crackers.  Gradually reintroduce nutrient-rich foods as tolerated or as told by your health care provider. This includes: ? Well-cooked protein foods, such as eggs, lean meats like fish or chicken without skin, and tofu. ? Peeled, seeded, and soft-cooked fruits and vegetables. ? Low-fat dairy products. ? Whole grains.  Take vitamin and mineral supplements as told by your health care provider.   Preventing dehydration  Start by sipping water or a solution to prevent dehydration (oral rehydration solution, ORS). This is a drink that helps replace fluids and minerals your body has lost. You can buy an ORS at pharmacies and retail stores.  Try to drink at least 8-10 cups (2,000-2,500 mL) of fluid each day to help replace lost  fluids. If you have urine that is pale yellow, you are getting enough fluids.  You may drink other liquids in addition to water, such as fruit juice that you have added water to (diluted fruit juice) or low-calorie sports drinks, as tolerated or as told by your health care provider.  Avoid drinks with caffeine, such as coffee, tea, or soft drinks.  Avoid alcohol.   Summary  When you have diarrhea, it is important to choose the right foods and drinks to relieve diarrhea, to replace lost fluids and nutrients, and to prevent dehydration.  Make sure you drink enough fluid to keep your urine pale yellow.  You may benefit from eating bland foods at first. Gradually reintroduce healthy, nutrient-rich foods as tolerated or as told by your health care provider.  Avoid foods that make your diarrhea worse, such as fried, greasy, or spicy foods. This information is not intended to replace advice given to you by your health care provider. Make sure you discuss any questions you have with your health care provider. Document Revised: 08/25/2019 Document Reviewed: 08/25/2019 Elsevier Patient Education  2021 Elsevier Inc.  

## 2021-06-08 ENCOUNTER — Encounter: Payer: BC Managed Care – PPO | Admitting: Internal Medicine

## 2021-06-19 ENCOUNTER — Encounter: Payer: Self-pay | Admitting: Internal Medicine

## 2021-06-19 ENCOUNTER — Other Ambulatory Visit: Payer: Self-pay

## 2021-06-19 ENCOUNTER — Ambulatory Visit (INDEPENDENT_AMBULATORY_CARE_PROVIDER_SITE_OTHER): Payer: 59 | Admitting: Internal Medicine

## 2021-06-19 VITALS — BP 134/80 | HR 70 | Temp 98.0°F | Ht 70.5 in | Wt 304.0 lb

## 2021-06-19 DIAGNOSIS — E559 Vitamin D deficiency, unspecified: Secondary | ICD-10-CM | POA: Diagnosis not present

## 2021-06-19 DIAGNOSIS — R739 Hyperglycemia, unspecified: Secondary | ICD-10-CM | POA: Diagnosis not present

## 2021-06-19 DIAGNOSIS — R238 Other skin changes: Secondary | ICD-10-CM | POA: Diagnosis not present

## 2021-06-19 DIAGNOSIS — R030 Elevated blood-pressure reading, without diagnosis of hypertension: Secondary | ICD-10-CM | POA: Diagnosis not present

## 2021-06-19 DIAGNOSIS — Z6841 Body Mass Index (BMI) 40.0 and over, adult: Secondary | ICD-10-CM

## 2021-06-19 DIAGNOSIS — E538 Deficiency of other specified B group vitamins: Secondary | ICD-10-CM

## 2021-06-19 DIAGNOSIS — Z0001 Encounter for general adult medical examination with abnormal findings: Secondary | ICD-10-CM | POA: Diagnosis not present

## 2021-06-19 LAB — LIPID PANEL
Cholesterol: 145 mg/dL (ref 0–200)
HDL: 51 mg/dL (ref >39.00)
LDL Cholesterol: 66 mg/dL (ref 0–99)
NonHDL: 94.16
Total CHOL/HDL Ratio: 3
Triglycerides: 139 mg/dL (ref 0.0–149.0)
VLDL: 27.8 mg/dL (ref 0.0–40.0)

## 2021-06-19 LAB — CBC WITH DIFFERENTIAL/PLATELET
Basophils Absolute: 0.1 10*3/uL (ref 0.0–0.1)
Basophils Relative: 1.1 % (ref 0.0–3.0)
Eosinophils Absolute: 0.2 10*3/uL (ref 0.0–0.7)
Eosinophils Relative: 3.5 % (ref 0.0–5.0)
HCT: 41.3 % (ref 39.0–52.0)
Hemoglobin: 14.1 g/dL (ref 13.0–17.0)
Lymphocytes Relative: 25 % (ref 12.0–46.0)
Lymphs Abs: 1.7 10*3/uL (ref 0.7–4.0)
MCHC: 34.2 g/dL (ref 30.0–36.0)
MCV: 86.4 fl (ref 78.0–100.0)
Monocytes Absolute: 0.5 10*3/uL (ref 0.1–1.0)
Monocytes Relative: 8 % (ref 3.0–12.0)
Neutro Abs: 4.2 10*3/uL (ref 1.4–7.7)
Neutrophils Relative %: 62.4 % (ref 43.0–77.0)
Platelets: 249 10*3/uL (ref 150.0–400.0)
RBC: 4.78 Mil/uL (ref 4.22–5.81)
RDW: 13.5 % (ref 11.5–15.5)
WBC: 6.8 10*3/uL (ref 4.0–10.5)

## 2021-06-19 LAB — HEPATIC FUNCTION PANEL
ALT: 25 U/L (ref 0–53)
AST: 15 U/L (ref 0–37)
Albumin: 4.3 g/dL (ref 3.5–5.2)
Alkaline Phosphatase: 67 U/L (ref 39–117)
Bilirubin, Direct: 0.1 mg/dL (ref 0.0–0.3)
Total Bilirubin: 0.5 mg/dL (ref 0.2–1.2)
Total Protein: 6.8 g/dL (ref 6.0–8.3)

## 2021-06-19 LAB — URINALYSIS, ROUTINE W REFLEX MICROSCOPIC
Bilirubin Urine: NEGATIVE
Hgb urine dipstick: NEGATIVE
Ketones, ur: NEGATIVE
Leukocytes,Ua: NEGATIVE
Nitrite: NEGATIVE
Specific Gravity, Urine: 1.02 (ref 1.000–1.030)
Total Protein, Urine: NEGATIVE
Urine Glucose: NEGATIVE
Urobilinogen, UA: 0.2 (ref 0.0–1.0)
pH: 7 (ref 5.0–8.0)

## 2021-06-19 LAB — VITAMIN D 25 HYDROXY (VIT D DEFICIENCY, FRACTURES): VITD: 17.63 ng/mL — ABNORMAL LOW (ref 30.00–100.00)

## 2021-06-19 LAB — BASIC METABOLIC PANEL
BUN: 16 mg/dL (ref 6–23)
CO2: 30 mEq/L (ref 19–32)
Calcium: 9.6 mg/dL (ref 8.4–10.5)
Chloride: 105 mEq/L (ref 96–112)
Creatinine, Ser: 0.82 mg/dL (ref 0.40–1.50)
GFR: 108.62 mL/min (ref >60.00)
Glucose, Bld: 89 mg/dL (ref 70–99)
Potassium: 4.3 mEq/L (ref 3.5–5.1)
Sodium: 142 mEq/L (ref 135–145)

## 2021-06-19 LAB — TSH: TSH: 1.99 u[IU]/mL (ref 0.35–5.50)

## 2021-06-19 LAB — HEMOGLOBIN A1C: Hgb A1c MFr Bld: 5.1 % (ref 4.6–6.5)

## 2021-06-19 LAB — PSA: PSA: 0.29 ng/mL (ref 0.10–4.00)

## 2021-06-19 LAB — VITAMIN B12: Vitamin B-12: 262 pg/mL (ref 211–911)

## 2021-06-19 NOTE — Progress Notes (Signed)
Patient ID: Carlos Boyd, male   DOB: 06/18/1981, 40 y.o.   MRN: [id number]         Chief Complaint:: wellness exam and low vit d and b12, obesity, scalp dermatitis       HPI:  Carlos Boyd is a 40 y.o. male here for wellness exam; declines tdap o/w up to date                        Also s/p covid infection about July 2022, apparently no long haul symptoms such as fatigue or sob.  Pt denies chest pain, increased sob or doe, wheezing, orthopnea, PND, increased LE swelling, palpitations, dizziness or syncope.   Pt denies polydipsia, polyuria,.  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  Does have mild itchy red spots to scalp mostly left pareital area in the hair for unclear reason, mild, constant for 2 mo, nothing makes better or worse.  Denies worsening depressive symptoms, suicidal ideation, or panic; Not interested in wt management referral for now.     Wt Readings from Last 3 Encounters:  06/19/21 (!) 304 lb (137.9 kg)  06/06/20 277 lb (125.6 kg)  06/04/19 243 lb (110.2 kg)   BP Readings from Last 3 Encounters:  06/19/21 134/80  06/06/20 (!) 138/96  06/04/19 (!) 142/88   Immunization History  Administered Date(s) Administered   Influenza,inj,Quad PF,6+ Mos 06/06/2015, 06/01/2016, 05/31/2017, 06/02/2018, 06/04/2019, 06/06/2020   Influenza-Unspecified 06/02/2021   Moderna Sars-Covid-2 Vaccination 11/09/2019, 12/07/2019   PPD Test 02/29/2016   Pfizer Covid-19 Vaccine Bivalent Booster 14yrs & up 06/02/2021   Tdap 11/29/2014  There are no preventive care reminders to display for this patient.    Past Medical History:  Diagnosis Date   Morbid obesity (HCC) 06/02/2017   Past Surgical History:  Procedure Laterality Date   right arthroscopy knee     [place], [place]   WISDOM TOOTH EXTRACTION      reports that he has never smoked. He has never used smokeless tobacco. He reports current alcohol use of about 1.0  standard drink per week. He reports that he does not use drugs. family history includes Diabetes in his maternal grandfather; Healthy in his father and mother. No Known Allergies Current Outpatient Medications on File Prior to Visit  Medication Sig Dispense Refill   clotrimazole-betamethasone (LOTRISONE) cream APPLY EXTERNALLY TO THE AFFECTED AREA TWICE DAILY 30 g 1   No current facility-administered medications on file prior to visit.        ROS:  All others reviewed and negative.  Objective        PE:  BP 134/80 (BP Location: Right Arm, Patient Position: Sitting, Cuff Size: Large)   Pulse 70   Temp 98 F (36.7 C) (Oral)   Ht 5' 10.5" (1.791 m)   Wt (!) 304 lb (137.9 kg)   SpO2 97%   BMI 43.00 kg/m                 Constitutional: Pt appears in NAD               HENT: Head: NCAT.                Right Ear: External ear normal.                 Left Ear: External ear normal.  Eyes: . Pupils are equal, round, and reactive to light. Conjunctivae and EOM are normal               Nose: without d/c or deformity               Neck: Neck supple. Gross normal ROM               Cardiovascular: Normal rate and regular rhythm.                 Pulmonary/Chest: Effort normal and breath sounds without rales or wheezing.                Abd:  Soft, NT, ND, + BS, no organomegaly               Neurological: Pt is alert. At baseline orientation, motor grossly intact               Skin: Skin is warm. Has mild itchy rash to left parietal area in the hair, no other new lesions, LE edema - none               Psychiatric: Pt behavior is normal without agitation   Micro: none  Cardiac tracings I have personally interpreted today:  none  Pertinent Radiological findings (summarize): none   Lab Results  Component Value Date   WBC 6.8 06/19/2021   HGB 14.1 06/19/2021   HCT 41.3 06/19/2021   PLT 249.0 06/19/2021   GLUCOSE 89 06/19/2021   CHOL 145 06/19/2021   TRIG 139.0 06/19/2021    HDL 51.00 06/19/2021   LDLCALC 66 06/19/2021   ALT 25 06/19/2021   AST 15 06/19/2021   NA 142 06/19/2021   K 4.3 06/19/2021   CL 105 06/19/2021   CREATININE 0.82 06/19/2021   BUN 16 06/19/2021   CO2 30 06/19/2021   TSH 1.99 06/19/2021   PSA 0.29 06/19/2021   HGBA1C 5.1 06/19/2021   Assessment/Plan:  Carlos Boyd is a 39 y.o. White or Caucasian [1] male with  has a past medical history of Morbid obesity (HCC) (06/02/2017).  Encounter for well adult exam with abnormal findings Age and sex appropriate education and counseling updated with regular exercise and diet Referrals for preventative services - none needed Immunizations addressed - declines tdap, Smoking counseling  - none needed Evidence for depression or other mood disorder - none significant Most recent labs reviewed. I have personally reviewed and have noted: 1) the patient's medical and social history 2) The patient's current medications and supplements 3) The patient's height, weight, and BMI have been recorded in the chart   Elevated blood pressure reading without diagnosis of hypertension BP Readings from Last 3 Encounters:  06/19/21 134/80  06/06/20 (!) 138/96  06/04/19 (!) 142/88   Stable, pt to continue medical treatment wt control, low salt diet, excercise  Hyperglycemia Lab Results  Component Value Date   HGBA1C 5.1 06/19/2021   Stable, pt to continue current medical treatment  - diet   Vitamin D deficiency Last vitamin D Lab Results  Component Value Date   VD25OH 17.63 (L) 06/19/2021   low, to start oral replacement   Scalp irritation Mild for predpac asd, refer dermatology  Morbid obesity with BMI of 40.0-44.9, adult (HCC) Worsening uncontrolled, pt declines referral wt management for now  B12 deficiency Lab Results  Component Value Date   VITAMINB12 262 06/19/2021   Ow normal, to start oral replacement - b12 1000 mcg qd  Followup:  Return in about 1 year (around  06/19/2022).  Oliver Barre, MD 06/23/2021 9:06 PM  [facility] - [facility]

## 2021-06-19 NOTE — Patient Instructions (Addendum)
Please consider the referral to Weight Management for possible monjouro  Please take OTC Vitamin D3 at 2000 units per day, indefinitely, and the B12 at 1000 mcg per day  Please take all new medication as prescribed - the prednisone for a few days for the scalp  You will be contacted regarding the referral for: dermatology  Please continue all other medications as before, and refills have been done if requested.  Please have the pharmacy call with any other refills you may need.  Please continue your efforts at being more active, low cholesterol diet, and weight control.  You are otherwise up to date with prevention measures today.  Please keep your appointments with your specialists as you may have planned - urology for the vasectomy soon  Please go to the LAB at the blood drawing area for the tests to be done  You will be contacted by phone if any changes need to be made immediately.  Otherwise, you will receive a letter about your results with an explanation, but please check with MyChart first.  Please remember to sign up for MyChart if you have not done so, as this will be important to you in the future with finding out test results, communicating by private email, and scheduling acute appointments online when needed.  Please make an Appointment to return for your 1 year visit, or sooner if needed

## 2021-06-23 ENCOUNTER — Encounter: Payer: Self-pay | Admitting: Internal Medicine

## 2021-06-23 DIAGNOSIS — E538 Deficiency of other specified B group vitamins: Secondary | ICD-10-CM | POA: Insufficient documentation

## 2021-06-23 NOTE — Assessment & Plan Note (Signed)
Lab Results  Component Value Date   VITAMINB12 262 06/19/2021   Ow normal, to start oral replacement - b12 1000 mcg qd

## 2021-06-23 NOTE — Assessment & Plan Note (Signed)
BP Readings from Last 3 Encounters:  06/19/21 134/80  06/06/20 (!) 138/96  06/04/19 (!) 142/88   Stable, pt to continue medical treatment wt control, low salt diet, excercise

## 2021-06-23 NOTE — Assessment & Plan Note (Signed)
Last vitamin D Lab Results  Component Value Date   VD25OH 17.63 (L) 06/19/2021   low, to start oral replacement

## 2021-06-23 NOTE — Assessment & Plan Note (Signed)
Age and sex appropriate education and counseling updated with regular exercise and diet Referrals for preventative services - none needed Immunizations addressed - declines tdap, Smoking counseling  - none needed Evidence for depression or other mood disorder - none significant Most recent labs reviewed. I have personally reviewed and have noted: 1) the patient's medical and social history 2) The patient's current medications and supplements 3) The patient's height, weight, and BMI have been recorded in the chart

## 2021-06-23 NOTE — Assessment & Plan Note (Signed)
Mild for predpac asd, refer dermatology

## 2021-06-23 NOTE — Assessment & Plan Note (Signed)
Worsening uncontrolled, pt declines referral wt management for now

## 2021-06-23 NOTE — Assessment & Plan Note (Signed)
Lab Results  Component Value Date   HGBA1C 5.1 06/19/2021   Stable, pt to continue current medical treatment  - diet

## 2021-07-23 HISTORY — PX: VASECTOMY: SHX75

## 2021-10-21 ENCOUNTER — Encounter: Payer: Self-pay | Admitting: Internal Medicine

## 2022-01-07 ENCOUNTER — Encounter: Payer: Self-pay | Admitting: Emergency Medicine

## 2022-01-07 ENCOUNTER — Ambulatory Visit
Admission: EM | Admit: 2022-01-07 | Discharge: 2022-01-07 | Disposition: A | Discharge status: Home / Self Care | Payer: 59 | Attending: Emergency Medicine | Admitting: Emergency Medicine

## 2022-01-07 DIAGNOSIS — Z20818 Contact with and (suspected) exposure to other bacterial communicable diseases: Secondary | ICD-10-CM

## 2022-01-07 DIAGNOSIS — R519 Headache, unspecified: Secondary | ICD-10-CM

## 2022-01-07 LAB — POCT RAPID STREP A (OFFICE): Rapid Strep A Screen: NEGATIVE

## 2022-01-07 MED ORDER — AMOXICILLIN 500 MG PO CAPS: 1000.0000 mg | ORAL_CAPSULE | Freq: Every day | ORAL | 0 refills | Status: AC

## 2022-01-07 NOTE — Discharge Instructions (Signed)
Your strep test today is negative.  Streptococcal throat culture will be performed per our protocol.  The result of your throat culture will be posted to your MyChart once it is complete, this typically takes 3 to 5 days.  Because of your exposure to strep throat and based on my physical exam findings, I recommend that you begin antibiotics now for presumed strep throat instead of waiting for the strep culture result.  I have sent a prescription to your pharmacy.  After 24 hours of antibiotics, you should begin to feel significantly better.     After 24 hours of taking antibiotics, please discard your toothbrush as well as any other oral devices that you are currently using and replace them with new ones to avoid reinfection.   Once you have been on antibiotics for a full 24 hours, you are no longer considered contagious.   I have provided you with a note to return to work.   Even if you are feeling better and your throat culture is negative, please make sure that you finish the full 10-day course and do not skip any doses.     Please see the list below for recommended medications, dosages and frequencies to provide relief of your current symptoms:    Amoxicillin:  take 2 tablets tablet once daily for 10 days, you can take it with or without food.  This antibiotic can cause upset stomach, this will resolve once antibiotics are complete.  You are welcome to use a probiotic, eat yogurt, take Imodium while taking this medication.  Please avoid other systemic medications such as Maalox, Pepto-Bismol or milk of magnesia as they can interfere with your body's ability to absorb the antibiotics.  Advil, Motrin (ibuprofen): This is a good anti-inflammatory medication which addresses aches, pains and inflammation of the upper airways that causes sinus and nasal congestion as well as in the lower airways which makes your cough feel tight and sometimes burn.  I recommend that you take between 400 to 600 mg every 6-8  hours as needed.      Tylenol (acetaminophen): This is a good fever reducer.  If your body temperature rises above 101.5 as measured with a thermometer, it is recommended that you take 1,000 mg every 8 hours until your temperature falls below 101.5, please not take more than 3,000 mg of acetaminophen either as a separate medication or as in ingredient in an over-the-counter cold/flu preparation within a 24-hour period.      Please follow-up within the next 5-7 days either with your primary care provider or urgent care if your symptoms do not resolve.  If you do not have a primary care provider, we will assist you in finding one.   Thank you for visiting urgent care today.  We appreciate the opportunity to participate in your care.

## 2022-01-07 NOTE — ED Triage Notes (Signed)
Pt here for exposure to strep throat and just has a headache today. No other sxs present.

## 2022-01-07 NOTE — ED Provider Notes (Signed)
[facility]    CSN: [id number] Arrival date & time: 01/07/22  1038    HISTORY  No chief complaint on file.  HPI Carlos Boyd is a 41 y.o. male. Patient presents urgent care today complaining of headache, states his daughter tested positive for strep throat on 12/25/2021 and completed a 10-day course of antibiotics on 01/04/2022.  Patient denies sore throat at this time.  Patient has normal vital signs on arrival with the exception of markedly elevated blood pressure.  Patient denies diagnosis of hypertension but per EMR, patient has been diagnosed with elevated blood pressure reading without diagnosis of hypertension..  The history is provided by the patient.   Past Medical History:  Diagnosis Date  . Morbid obesity (HCC) 06/02/2017   Patient Active Problem List   Diagnosis Date Noted  . B12 deficiency 06/23/2021  . Encounter for well adult exam with abnormal findings 06/19/2021  . Scalp irritation 06/19/2021  . Vitamin D deficiency 06/12/2020  . Hyperglycemia 06/04/2019  . Exposure to COVID-19 virus 02/25/2019  . Low back pain 02/25/2019  . Tinea cruris 02/01/2016  . Testicular pain, right 02/01/2016  . Routine general medical examination at a health care facility 11/29/2014  . Rash and nonspecific skin eruption 11/29/2014  . Elevated blood pressure reading without diagnosis of hypertension 11/04/2014  . Morbid obesity with BMI of 40.0-44.9, adult (HCC) 11/04/2014   Past Surgical History:  Procedure Laterality Date  . right arthroscopy knee     [place], [place]  . WISDOM TOOTH EXTRACTION      Home Medications    Prior to Admission medications   Not on File   Family History Family History  Problem Relation Age of Onset  . Healthy Mother   . Healthy Father   . Diabetes Maternal Grandfather    Social History Social History   Tobacco Use  . Smoking status: Never  . Smokeless tobacco: Never  Substance Use Topics  . Alcohol use: Yes    Alcohol/week:  1.0 standard drink of alcohol    Types: 1 Cans of beer per week  . Drug use: No   Allergies   Patient has no known allergies.  Review of Systems Review of Systems Pertinent findings noted in history of present illness.   Physical Exam Triage Vital Signs ED Triage Vitals  Enc Vitals Group     BP 05/19/21 0827 (!) 147/82     Pulse Rate 05/19/21 0827 72     Resp 05/19/21 0827 18     Temp 05/19/21 0827 98.3 F (36.8 C)     Temp Source 05/19/21 0827 Oral     SpO2 05/19/21 0827 98 %     Weight --      Height --      Head Circumference --      Peak Flow --      Pain Score 05/19/21 0826 5     Pain Loc --      Pain Edu? --      Excl. in GC? --   No data found.  Updated Vital Signs There were no vitals taken for this visit.  Physical Exam  Visual Acuity Right Eye Distance:   Left Eye Distance:   Bilateral Distance:    Right Eye Near:   Left Eye Near:    Bilateral Near:     UC Couse / Diagnostics / Procedures:    EKG  Radiology No results found.  Procedures Procedures (including critical care time)  UC Diagnoses /  Final Clinical Impressions(s)   I have reviewed the triage vital signs and the nursing notes.  Pertinent labs & imaging results that were available during my care of the patient were reviewed by me and considered in my medical decision making (see chart for details).   Final diagnoses:  None   ***  ED Prescriptions   None    PDMP not reviewed this encounter.  Pending results:  Labs Reviewed - No data to display  Medications Ordered in UC: Medications - No data to display  Disposition Upon Discharge:  Condition: stable for discharge home Home: take medications as prescribed; routine discharge instructions as discussed; follow up as advised.  Patient presented with an acute illness with associated systemic symptoms and significant discomfort requiring urgent management. In my opinion, this is a condition that a prudent lay person (someone  who possesses an average knowledge of health and medicine) may potentially expect to result in complications if not addressed urgently such as respiratory distress, impairment of bodily function or dysfunction of bodily organs.   Routine symptom specific, illness specific and/or disease specific instructions were discussed with the patient and/or caregiver at length.   As such, the patient has been evaluated and assessed, work-up was performed and treatment was provided in alignment with urgent care protocols and evidence based medicine.  Patient/parent/caregiver has been advised that the patient may require follow up for further testing and treatment if the symptoms continue in spite of treatment, as clinically indicated and appropriate.  If the patient was tested for COVID-19, Influenza and/or RSV, then the patient/parent/guardian was advised to isolate at home pending the results of his/her diagnostic coronavirus test and potentially longer if they're positive. I have also advised pt that if his/her COVID-19 test returns positive, it's recommended to self-isolate for at least 10 days after symptoms first appeared AND until fever-free for 24 hours without fever reducer AND other symptoms have improved or resolved. Discussed self-isolation recommendations as well as instructions for household member/close contacts as per the [facility] and [place] DHHS, and also gave patient the COVID packet with this information.  Patient/parent/caregiver has been advised to return to the [facility] or PCP in 3-5 days if no better; to PCP or the Emergency Department if new signs and symptoms develop, or if the current signs or symptoms continue to change or worsen for further workup, evaluation and treatment as clinically indicated and appropriate  The patient will follow up with their current PCP if and as advised. If the patient does not currently have a PCP we will assist them in obtaining one.   The patient may need specialty follow  up if the symptoms continue, in spite of conservative treatment and management, for further workup, evaluation, consultation and treatment as clinically indicated and appropriate.  Patient/parent/caregiver verbalized understanding and agreement of plan as discussed.  All questions were addressed during visit.  Please see discharge instructions below for further details of plan.  Discharge Instructions: Discharge Instructions   None     This office note has been dictated using Dragon speech recognition software.  Unfortunately, and despite my best efforts, this method of dictation can sometimes lead to occasional typographical or grammatical errors.  I apologize in advance if this occurs.

## 2022-01-09 LAB — CULTURE, GROUP A STREP (THRC)

## 2022-03-14 ENCOUNTER — Encounter: Payer: Self-pay | Admitting: Internal Medicine

## 2022-03-14 ENCOUNTER — Telehealth: Payer: 59 | Admitting: Internal Medicine

## 2022-03-14 DIAGNOSIS — R739 Hyperglycemia, unspecified: Secondary | ICD-10-CM

## 2022-03-14 DIAGNOSIS — F524 Premature ejaculation: Secondary | ICD-10-CM | POA: Diagnosis not present

## 2022-03-14 DIAGNOSIS — Z125 Encounter for screening for malignant neoplasm of prostate: Secondary | ICD-10-CM

## 2022-03-14 DIAGNOSIS — E538 Deficiency of other specified B group vitamins: Secondary | ICD-10-CM | POA: Diagnosis not present

## 2022-03-14 DIAGNOSIS — E559 Vitamin D deficiency, unspecified: Secondary | ICD-10-CM

## 2022-03-14 MED ORDER — SERTRALINE HCL 100 MG PO TABS: 100.0000 mg | ORAL_TABLET | Freq: Every day | ORAL | 3 refills | Status: DC

## 2022-03-14 NOTE — Assessment & Plan Note (Signed)
Lab Results  Component Value Date   HGBA1C 5.1 06/19/2021   Stable, pt to continue current medical treatment  - diet, wt control, excercise

## 2022-03-14 NOTE — Patient Instructions (Addendum)
Please take all new medication as prescribed 

## 2022-03-14 NOTE — Progress Notes (Signed)
Patient ID: Carlos Boyd, male   DOB: 1980-09-22, 41 y.o.   MRN: [id number]  Virtual Visit via Video Note  I connected with Carlos Boyd on 03/14/22 at  1:00 PM EDT by a video enabled telemedicine application and verified that I am speaking with the correct person using two identifiers.  Location of all participants at home Patient: at home Provider: at office   I discussed the limitations of evaluation and management by telemedicine and the availability of in person appointments. The patient expressed understanding and agreed to proceed.  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   Denies urinary symptoms such as dysuria, frequency, urgency, flank pain, hematuria or n/v, fever, chills.  Pt denies chest pain, increased sob or doe, wheezing, orthopnea, PND, increased LE swelling, palpitations, dizziness or syncope.  No other new complaints  Denies worsening depressive symptoms, suicidal ideation, or panic; has ongoing anxiety Past Medical History:  Diagnosis Date   Morbid obesity (HCC) 06/02/2017   Past Surgical History:  Procedure Laterality Date   right arthroscopy knee     [place], [place]   WISDOM TOOTH EXTRACTION      reports that he has never smoked. He has never used smokeless tobacco. He reports current alcohol use of about 1.0 standard drink of alcohol per week. He reports that he does not use drugs. family history includes Diabetes in his maternal grandfather; Healthy in his father and mother. No Known Allergies No current outpatient medications on file prior to visit.   No current facility-administered medications on file prior to visit.    Observations/Objective: Alert, NAD, appropriate mood and affect, resps normal, cn 2-12 intact, moves all 4s, no visible rash or swelling Lab Results   Component Value Date   WBC 6.8 06/19/2021   HGB 14.1 06/19/2021   HCT 41.3 06/19/2021   PLT 249.0 06/19/2021   GLUCOSE 89 06/19/2021   CHOL 145 06/19/2021   TRIG 139.0 06/19/2021   HDL 51.00 06/19/2021   LDLCALC 66 06/19/2021   ALT 25 06/19/2021   AST 15 06/19/2021   NA 142 06/19/2021   K 4.3 06/19/2021   CL 105 06/19/2021   CREATININE 0.82 06/19/2021   BUN 16 06/19/2021   CO2 30 06/19/2021   TSH 1.99 06/19/2021   PSA 0.29 06/19/2021   HGBA1C 5.1 06/19/2021   Assessment and Plan: See notes  Follow Up Instructions: See notes   I discussed the assessment and treatment plan with the patient. The patient was provided an opportunity to ask questions and all were answered. The patient agreed with the plan and demonstrated an understanding of the instructions.   The patient was advised to call back or seek an in-person evaluation if the symptoms worsen or if the condition fails to improve as anticipated.   Oliver Barre, MD

## 2022-03-14 NOTE — Assessment & Plan Note (Signed)
Lab Results  Component Value Date   VITAMINB12 262 06/19/2021   Low normal, reminded to start oral replacement - b12 1000 mcg qd

## 2022-03-14 NOTE — Assessment & Plan Note (Signed)
Last vitamin D Lab Results  Component Value Date   VD25OH 17.63 (L) 06/19/2021   Low, reminded to start oral replacement

## 2022-03-14 NOTE — Assessment & Plan Note (Signed)
D/w pt different SSRI options - ok for zoloft 10 mg qd, and f/u at next OV

## 2022-06-21 ENCOUNTER — Encounter: Payer: 59 | Admitting: Internal Medicine

## 2022-07-05 ENCOUNTER — Encounter: Payer: 59 | Admitting: Internal Medicine

## 2022-08-06 ENCOUNTER — Ambulatory Visit: Payer: BC Managed Care – PPO | Admitting: Internal Medicine

## 2022-08-06 ENCOUNTER — Encounter: Payer: Self-pay | Admitting: Internal Medicine

## 2022-08-06 VITALS — BP 134/76 | HR 73 | Temp 98.4°F | Ht 70.5 in | Wt 317.0 lb

## 2022-08-06 DIAGNOSIS — E538 Deficiency of other specified B group vitamins: Secondary | ICD-10-CM | POA: Diagnosis not present

## 2022-08-06 DIAGNOSIS — Z0001 Encounter for general adult medical examination with abnormal findings: Secondary | ICD-10-CM | POA: Diagnosis not present

## 2022-08-06 DIAGNOSIS — E559 Vitamin D deficiency, unspecified: Secondary | ICD-10-CM | POA: Diagnosis not present

## 2022-08-06 DIAGNOSIS — Z125 Encounter for screening for malignant neoplasm of prostate: Secondary | ICD-10-CM | POA: Diagnosis not present

## 2022-08-06 DIAGNOSIS — R739 Hyperglycemia, unspecified: Secondary | ICD-10-CM | POA: Diagnosis not present

## 2022-08-06 DIAGNOSIS — Z6841 Body Mass Index (BMI) 40.0 and over, adult: Secondary | ICD-10-CM

## 2022-08-06 LAB — CBC WITH DIFFERENTIAL/PLATELET
Basophils Absolute: 0 10*3/uL (ref 0.0–0.1)
Basophils Relative: 0.8 % (ref 0.0–3.0)
Eosinophils Absolute: 0.3 10*3/uL (ref 0.0–0.7)
Eosinophils Relative: 5.1 % — ABNORMAL HIGH (ref 0.0–5.0)
HCT: 41.8 % (ref 39.0–52.0)
Hemoglobin: 14.3 g/dL (ref 13.0–17.0)
Lymphocytes Relative: 29.5 % (ref 12.0–46.0)
Lymphs Abs: 1.7 10*3/uL (ref 0.7–4.0)
MCHC: 34.3 g/dL (ref 30.0–36.0)
MCV: 85.5 fl (ref 78.0–100.0)
Monocytes Absolute: 0.6 10*3/uL (ref 0.1–1.0)
Monocytes Relative: 10.2 % (ref 3.0–12.0)
Neutro Abs: 3.2 10*3/uL (ref 1.4–7.7)
Neutrophils Relative %: 54.4 % (ref 43.0–77.0)
Platelets: 264 10*3/uL (ref 150.0–400.0)
RBC: 4.89 Mil/uL (ref 4.22–5.81)
RDW: 14.1 % (ref 11.5–15.5)
WBC: 5.8 10*3/uL (ref 4.0–10.5)

## 2022-08-06 LAB — URINALYSIS, ROUTINE W REFLEX MICROSCOPIC
Bilirubin Urine: NEGATIVE
Hgb urine dipstick: NEGATIVE
Ketones, ur: 15 — AB
Leukocytes,Ua: NEGATIVE
Nitrite: NEGATIVE
RBC / HPF: NONE SEEN (ref 0–?)
Specific Gravity, Urine: 1.02 (ref 1.000–1.030)
Total Protein, Urine: NEGATIVE
Urine Glucose: NEGATIVE
Urobilinogen, UA: 0.2 (ref 0.0–1.0)
pH: 6 (ref 5.0–8.0)

## 2022-08-06 LAB — LIPID PANEL
Cholesterol: 135 mg/dL (ref 0–200)
HDL: 40.2 mg/dL (ref >39.00)
LDL Cholesterol: 82 mg/dL (ref 0–99)
NonHDL: 94.33
Total CHOL/HDL Ratio: 3
Triglycerides: 63 mg/dL (ref 0.0–149.0)
VLDL: 12.6 mg/dL (ref 0.0–40.0)

## 2022-08-06 LAB — HEPATIC FUNCTION PANEL
ALT: 61 U/L — ABNORMAL HIGH (ref 0–53)
AST: 68 U/L — ABNORMAL HIGH (ref 0–37)
Albumin: 4.2 g/dL (ref 3.5–5.2)
Alkaline Phosphatase: 76 U/L (ref 39–117)
Bilirubin, Direct: 0.1 mg/dL (ref 0.0–0.3)
Total Bilirubin: 0.6 mg/dL (ref 0.2–1.2)
Total Protein: 6.8 g/dL (ref 6.0–8.3)

## 2022-08-06 LAB — BASIC METABOLIC PANEL
BUN: 15 mg/dL (ref 6–23)
CO2: 28 mEq/L (ref 19–32)
Calcium: 9.2 mg/dL (ref 8.4–10.5)
Chloride: 103 mEq/L (ref 96–112)
Creatinine, Ser: 0.9 mg/dL (ref 0.40–1.50)
GFR: 104.77 mL/min (ref >60.00)
Glucose, Bld: 89 mg/dL (ref 70–99)
Potassium: 4.9 mEq/L (ref 3.5–5.1)
Sodium: 141 mEq/L (ref 135–145)

## 2022-08-06 LAB — TSH: TSH: 1.74 u[IU]/mL (ref 0.35–5.50)

## 2022-08-06 LAB — HEMOGLOBIN A1C: Hgb A1c MFr Bld: 5.3 % (ref 4.6–6.5)

## 2022-08-06 LAB — VITAMIN D 25 HYDROXY (VIT D DEFICIENCY, FRACTURES): VITD: 28.01 ng/mL — ABNORMAL LOW (ref 30.00–100.00)

## 2022-08-06 LAB — PSA: PSA: 0.25 ng/mL (ref 0.10–4.00)

## 2022-08-06 LAB — VITAMIN B12: Vitamin B-12: 1005 pg/mL — ABNORMAL HIGH (ref 211–911)

## 2022-08-06 MED ORDER — WEGOVY 0.25 MG/0.5ML ~~LOC~~ SOAJ: 0.2500 mg | SUBCUTANEOUS | 3 refills | Status: DC

## 2022-08-06 NOTE — Patient Instructions (Signed)
Please take all new medication as prescribed  - the wegovy if ok with insurance  Please continue all other medications as before, and refills have been done if requested.  Please have the pharmacy call with any other refills you may need.  Please continue your efforts at being more active, low cholesterol diet, and weight control.  You are otherwise up to date with prevention measures today.  Please keep your appointments with your specialists as you may have planned  Please go to the LAB at the blood drawing area for the tests to be done  You will be contacted by phone if any changes need to be made immediately.  Otherwise, you will receive a letter about your results with an explanation, but please check with MyChart first.  Please remember to sign up for MyChart if you have not done so, as this will be important to you in the future with finding out test results, communicating by private email, and scheduling acute appointments online when needed.  Please make an Appointment to return for your 1 year visit, or sooner if needed

## 2022-08-06 NOTE — Progress Notes (Unsigned)
Patient ID: Carlos Boyd, male   DOB: August 31, 1980, 42 y.o.   MRN: [id number]         Chief Complaint:: wellness exam and obesity, low b12, hyperglycemia, low vit d      HPI:  Carlos Boyd is a 42 y.o. male here for wellness exam; up to date                        Also over a year ago referred to wt loss management but never saw them.  Wt has peaked at home at 320 with 311 this am at home with better diet, wife helping.  Pt denies chest pain, increased sob or doe, wheezing, orthopnea, PND, increased LE swelling, palpitations, dizziness or syncope.   Pt denies polydipsia, polyuria, or new focal neuro s/s.    Pt denies fever, wt loss, night sweats, loss of appetite, or other constitutional symptoms    Wt Readings from Last 3 Encounters:  08/06/22 (!) 317 lb (143.8 kg)  06/19/21 (!) 304 lb (137.9 kg)  06/06/20 277 lb (125.6 kg)   BP Readings from Last 3 Encounters:  08/06/22 134/76  01/07/22 (!) 160/97  06/19/21 134/80   Immunization History  Administered Date(s) Administered   Influenza,inj,Quad PF,6+ Mos 06/06/2015, 06/01/2016, 05/31/2017, 06/02/2018, 06/04/2019, 06/06/2020   Influenza-Unspecified 06/02/2021, 07/05/2022   Moderna Sars-Covid-2 Vaccination 11/09/2019, 12/07/2019   PPD Test 02/29/2016   Pfizer Covid-19 Vaccine Bivalent Booster 63yrs & up 06/02/2021, 07/05/2022   Tdap 11/29/2014  There are no preventive care reminders to display for this patient.    Past Medical History:  Diagnosis Date   Morbid obesity ([place]) 06/02/2017   Past Surgical History:  Procedure Laterality Date   right arthroscopy knee     [place], [place]   WISDOM TOOTH EXTRACTION      reports that he has never smoked. He has never used smokeless tobacco. He reports current alcohol use of about 1.0 standard drink of alcohol per week. He reports that he does not use drugs. family history includes Diabetes in his maternal grandfather; Healthy in his father and mother. No Known Allergies Current Outpatient  Medications on File Prior to Visit  Medication Sig Dispense Refill   sertraline (ZOLOFT) 100 MG tablet Take 1 tablet (100 mg total) by mouth daily. 90 tablet 3   No current facility-administered medications on file prior to visit.        ROS:  All others reviewed and negative.  Objective        PE:  BP 134/76 (BP Location: Right Arm, Patient Position: Sitting, Cuff Size: Large)   Pulse 73   Temp 98.4 F (36.9 C) (Oral)   Ht 5' 10.5" (1.791 m)   Wt (!) 317 lb (143.8 kg)   SpO2 96%   BMI 44.84 kg/m                 Constitutional: Pt appears in NAD               HENT: Head: NCAT.                Right Ear: External ear normal.                 Left Ear: External ear normal.                Eyes: . Pupils are equal, round, and reactive to light. Conjunctivae and EOM are normal  Nose: without d/c or deformity               Neck: Neck supple. Gross normal ROM               Cardiovascular: Normal rate and regular rhythm.                 Pulmonary/Chest: Effort normal and breath sounds without rales or wheezing.                Abd:  Soft, NT, ND, + BS, no organomegaly               Neurological: Pt is alert. At baseline orientation, motor grossly intact               Skin: Skin is warm. No rashes, no other new lesions, LE edema - nnne               Psychiatric: Pt behavior is normal without agitation   Micro: none  Cardiac tracings I have personally interpreted today:  none  Pertinent Radiological findings (summarize): none   Lab Results  Component Value Date   WBC 5.8 08/06/2022   HGB 14.3 08/06/2022   HCT 41.8 08/06/2022   PLT 264.0 08/06/2022   GLUCOSE 89 08/06/2022   CHOL 135 08/06/2022   TRIG 63.0 08/06/2022   HDL 40.20 08/06/2022   LDLCALC 82 08/06/2022   ALT 61 (H) 08/06/2022   AST 68 (H) 08/06/2022   NA 141 08/06/2022   K 4.9 08/06/2022   CL 103 08/06/2022   CREATININE 0.90 08/06/2022   BUN 15 08/06/2022   CO2 28 08/06/2022   TSH 1.74 08/06/2022    PSA 0.25 08/06/2022   HGBA1C 5.3 08/06/2022   Assessment/Plan:  Carlos Boyd is a 42 y.o. White or Caucasian [1] male with  has a past medical history of Morbid obesity ([place]) (06/02/2017).  Encounter for well adult exam with abnormal findings Age and sex appropriate education and counseling updated with regular exercise and diet Referrals for preventative services - none needed Immunizations addressed - none needed Smoking counseling  - none needed Evidence for depression or other mood disorder - none significant Most recent labs reviewed. I have personally reviewed and have noted: 1) the patient's medical and social history 2) The patient's current medications and supplements 3) The patient's height, weight, and BMI have been recorded in the chart   B12 deficiency Lab Results  Component Value Date   VITAMINB12 1,005 (H) 08/06/2022   Stable, cont oral replacement - b12 1000 mcg qd   Hyperglycemia Lab Results  Component Value Date   HGBA1C 5.3 08/06/2022   Stable, pt to continue current medical treatment  - diet, wt control, and wegovy 0.25 mg weekly start  Morbid obesity with BMI of 40.0-44.9, adult ([place]) [place] for wegovy 0.25 mg weekly  Vitamin D deficiency Last vitamin D Lab Results  Component Value Date   VD25OH 28.01 (L) 08/06/2022   Low, to start oral replacement  Followup: Return in about 1 year (around 08/07/2023).  Cathlean Cower, MD 08/07/2022 8:31 PM [facility]

## 2022-08-07 ENCOUNTER — Encounter: Payer: Self-pay | Admitting: Internal Medicine

## 2022-08-07 NOTE — Assessment & Plan Note (Signed)
Lab Results  Component Value Date   HGBA1C 5.3 08/06/2022   Stable, pt to continue current medical treatment  - diet, wt control, and wegovy 0.25 mg weekly start

## 2022-08-07 NOTE — Assessment & Plan Note (Signed)
Lab Results  Component Value Date   VITAMINB12 1,005 (H) 08/06/2022   Stable, cont oral replacement - b12 1000 mcg qd

## 2022-08-07 NOTE — Assessment & Plan Note (Signed)
Last vitamin D Lab Results  Component Value Date   VD25OH 28.01 (L) 08/06/2022   Low, to start oral replacement

## 2022-08-07 NOTE — Assessment & Plan Note (Signed)
Ok for wegovy 0.25 mg weekly

## 2022-08-07 NOTE — Assessment & Plan Note (Signed)

## 2022-08-30 ENCOUNTER — Other Ambulatory Visit (HOSPITAL_COMMUNITY): Payer: Self-pay

## 2022-08-30 ENCOUNTER — Telehealth: Payer: Self-pay

## 2022-08-30 NOTE — Telephone Encounter (Signed)
Patient Advocate Encounter  Prior Authorization for [facility] has been approved.    PA# [id number] Effective dates: 08/21/2022 through 02/19/2023  Placed a call to the pharmacy to notify of the approval. [facility] is on a back order.  Approval letter attached to chart.

## 2022-08-31 ENCOUNTER — Telehealth: Payer: Self-pay

## 2022-08-31 NOTE — Telephone Encounter (Signed)
PA submitted  Key: [id number]

## 2022-12-03 ENCOUNTER — Ambulatory Visit: Payer: BC Managed Care – PPO | Admitting: Family Medicine

## 2022-12-03 VITALS — BP 138/82 | HR 80 | Temp 97.7°F | Resp 20 | Ht 70.5 in | Wt 275.0 lb

## 2022-12-03 DIAGNOSIS — S90561A Insect bite (nonvenomous), right ankle, initial encounter: Secondary | ICD-10-CM

## 2022-12-03 DIAGNOSIS — W57XXXA Bitten or stung by nonvenomous insect and other nonvenomous arthropods, initial encounter: Secondary | ICD-10-CM | POA: Diagnosis not present

## 2022-12-03 MED ORDER — TRIAMCINOLONE ACETONIDE 0.1 % EX CREA
1.0000 | TOPICAL_CREAM | Freq: Two times a day (BID) | CUTANEOUS | 0 refills | Status: AC
Start: 2022-12-03 — End: ?

## 2022-12-03 NOTE — Progress Notes (Signed)
   Assessment & Plan:  1. Tick bite of right ankle, initial encounter Education provided on tick bites.  Encouraged to cover with liquid Band-Aid to help with itching. - triamcinolone cream (KENALOG) 0.1 %; Apply 1 Application topically 2 (two) times daily.  Dispense: 80 g; Refill: 0   Follow up plan: Return if symptoms worsen or fail to improve.  Deliah Boston, MSN, APRN, FNP-C  Subjective:  HPI: Carlos Boyd is a 42 y.o. male presenting on 12/03/2022 for Insect Bite (Tick found Friday - Right ankle - now area is pink and itching)  Patient reports pulling a tick off of his right ankle three days ago. The area is now pink and itches.  Denies headaches, fever, increased body aches, rash, nausea or vomiting.   ROS: Negative unless specifically indicated above in HPI.   Relevant past medical history reviewed and updated as indicated.   Allergies and medications reviewed and updated.   Current Outpatient Medications:    cholecalciferol (VITAMIN D3) 25 MCG (1000 UNIT) tablet, Take 1,000 Units by mouth daily., Disp: , Rfl:    cyanocobalamin (VITAMIN B12) 1000 MCG tablet, Take 1,000 mcg by mouth daily., Disp: , Rfl:    sertraline (ZOLOFT) 100 MG tablet, Take 1 tablet (100 mg total) by mouth daily., Disp: 90 tablet, Rfl: 3  No Known Allergies  Objective:   BP 138/82   Pulse 80   Temp 97.7 F (36.5 C)   Resp 20   Ht 5' 10.5" (1.791 m)   Wt 275 lb (124.7 kg)   BMI 38.90 kg/m    Physical Exam Vitals reviewed.  Constitutional:      General: He is not in acute distress.    Appearance: Normal appearance. He is not ill-appearing, toxic-appearing or diaphoretic.  HENT:     Head: Normocephalic and atraumatic.  Eyes:     General: No scleral icterus.       Right eye: No discharge.        Left eye: No discharge.     Conjunctiva/sclera: Conjunctivae normal.  Cardiovascular:     Rate and Rhythm: Normal rate.  Pulmonary:     Effort: Pulmonary effort is normal. No respiratory  distress.  Musculoskeletal:        General: Normal range of motion.     Cervical back: Normal range of motion.  Skin:    General: Skin is warm and dry.     Comments: Slightly raised area where patient pulled off tick with very mild pink color to it. No swelling, warmth, drainage, or rash.  Neurological:     Mental Status: He is alert and oriented to person, place, and time. Mental status is at baseline.  Psychiatric:        Mood and Affect: Mood normal.        Behavior: Behavior normal.        Thought Content: Thought content normal.        Judgment: Judgment normal.

## 2023-04-22 ENCOUNTER — Telehealth (INDEPENDENT_AMBULATORY_CARE_PROVIDER_SITE_OTHER): Payer: BC Managed Care – PPO | Admitting: Internal Medicine

## 2023-04-22 ENCOUNTER — Encounter: Payer: Self-pay | Admitting: Internal Medicine

## 2023-04-22 DIAGNOSIS — E559 Vitamin D deficiency, unspecified: Secondary | ICD-10-CM

## 2023-04-22 DIAGNOSIS — R739 Hyperglycemia, unspecified: Secondary | ICD-10-CM | POA: Diagnosis not present

## 2023-04-22 DIAGNOSIS — F419 Anxiety disorder, unspecified: Secondary | ICD-10-CM | POA: Insufficient documentation

## 2023-04-22 DIAGNOSIS — E538 Deficiency of other specified B group vitamins: Secondary | ICD-10-CM

## 2023-04-22 MED ORDER — SERTRALINE HCL 100 MG PO TABS: 100.0000 mg | ORAL_TABLET | Freq: Every day | ORAL | 3 refills | Status: DC

## 2023-04-22 NOTE — Assessment & Plan Note (Signed)
Lab Results  Component Value Date   VITAMINB12 1,005 (H) 08/06/2022   Stable, cont oral replacement - b12 1000 mcg qd

## 2023-04-22 NOTE — Assessment & Plan Note (Signed)
Overall improved now stable, cont sertraline 100 every day, declines need for counseling referral

## 2023-04-22 NOTE — Progress Notes (Signed)
Patient ID: Carlos Boyd, male   DOB: 05-02-1981, 42 y.o.   MRN: [id number]  Virtual Visit via Video Note  I connected with Carlos Boyd on 04/22/23 at  2:00 PM EDT by a video enabled telemedicine application and verified that I am speaking with the correct person using two identifiers.  Location of all participants today Patient: at home Provider:  at office   I discussed the limitations of evaluation and management by telemedicine and the availability of in person appointments. The patient expressed understanding and agreed to proceed.  History of Present Illness: Here overall doing ok, Pt denies chest pain, increased sob or doe, wheezing, orthopnea, PND, increased LE swelling, palpitations, dizziness or syncope.   Pt denies polydipsia, polyuria, or new focal neuro s/s.    Pt denies fever, wt loss, night sweats, loss of appetite, or other constitutional symptoms  Denies worsening depressive symptoms, suicidal ideation, or panic; has ongoing anxiety, controlled with sertraline, asking for refill.   Past Medical History:  Diagnosis Date   Morbid obesity (HCC) 06/02/2017   Past Surgical History:  Procedure Laterality Date   right arthroscopy knee     [place], [place]   WISDOM TOOTH EXTRACTION      reports that he has never smoked. He has never used smokeless tobacco. He reports current alcohol use of about 1.0 standard drink of alcohol per week. He reports that he does not use drugs. family history includes Diabetes in his maternal grandfather; Healthy in his father and mother. No Known Allergies Current Outpatient Medications on File Prior to Visit  Medication Sig Dispense Refill   cholecalciferol (VITAMIN D3) 25 MCG (1000 UNIT) tablet Take 1,000 Units by mouth daily.     cyanocobalamin (VITAMIN B12) 1000 MCG tablet Take 1,000 mcg by mouth daily.     triamcinolone cream (KENALOG) 0.1 % Apply 1 Application topically 2 (two) times daily. 80 g 0   No current facility-administered  medications on file prior to visit.    Observations/Objective: Alert, NAD, appropriate mood and affect, resps normal, cn 2-12 intact, moves all 4s, no visible rash or swelling Lab Results  Component Value Date   WBC 5.8 08/06/2022   HGB 14.3 08/06/2022   HCT 41.8 08/06/2022   PLT 264.0 08/06/2022   GLUCOSE 89 08/06/2022   CHOL 135 08/06/2022   TRIG 63.0 08/06/2022   HDL 40.20 08/06/2022   LDLCALC 82 08/06/2022   ALT 61 (H) 08/06/2022   AST 68 (H) 08/06/2022   NA 141 08/06/2022   K 4.9 08/06/2022   CL 103 08/06/2022   CREATININE 0.90 08/06/2022   BUN 15 08/06/2022   CO2 28 08/06/2022   TSH 1.74 08/06/2022   PSA 0.25 08/06/2022   HGBA1C 5.3 08/06/2022   Assessment and Plan: See note  Follow Up Instructions: See notes   I discussed the assessment and treatment plan with the patient. The patient was provided an opportunity to ask questions and all were answered. The patient agreed with the plan and demonstrated an understanding of the instructions.   The patient was advised to call back or seek an in-person evaluation if the symptoms worsen or if the condition fails to improve as anticipated.  Oliver Barre, MD

## 2023-04-22 NOTE — Assessment & Plan Note (Signed)
Lab Results  Component Value Date   HGBA1C 5.3 08/06/2022   Stable, pt to continue current medical treatment  - diet, wt control

## 2023-04-22 NOTE — Patient Instructions (Signed)
Please continue all other medications as before, and refills have been done if requested.  Please have the pharmacy call with any other refills you may need.  Please continue your efforts at being more active, low cholesterol diet, and weight control.  Please keep your appointments with your specialists as you may have planned     

## 2023-04-22 NOTE — Assessment & Plan Note (Signed)
Last vitamin D Lab Results  Component Value Date   VD25OH 28.01 (L) 08/06/2022   Low, to start oral replacement

## 2023-05-16 ENCOUNTER — Ambulatory Visit (INDEPENDENT_AMBULATORY_CARE_PROVIDER_SITE_OTHER): Payer: BC Managed Care – PPO | Admitting: Internal Medicine

## 2023-05-16 ENCOUNTER — Other Ambulatory Visit: Payer: Self-pay | Admitting: Internal Medicine

## 2023-05-16 ENCOUNTER — Ambulatory Visit: Payer: BC Managed Care – PPO

## 2023-05-16 VITALS — BP 148/84 | HR 63 | Temp 98.6°F | Ht 70.5 in | Wt 252.0 lb

## 2023-05-16 DIAGNOSIS — Z6841 Body Mass Index (BMI) 40.0 and over, adult: Secondary | ICD-10-CM | POA: Diagnosis not present

## 2023-05-16 DIAGNOSIS — R1032 Left lower quadrant pain: Secondary | ICD-10-CM

## 2023-05-16 DIAGNOSIS — Z23 Encounter for immunization: Secondary | ICD-10-CM

## 2023-05-16 DIAGNOSIS — R739 Hyperglycemia, unspecified: Secondary | ICD-10-CM

## 2023-05-16 DIAGNOSIS — R030 Elevated blood-pressure reading, without diagnosis of hypertension: Secondary | ICD-10-CM

## 2023-05-16 DIAGNOSIS — M25552 Pain in left hip: Secondary | ICD-10-CM | POA: Diagnosis not present

## 2023-05-16 DIAGNOSIS — M25551 Pain in right hip: Secondary | ICD-10-CM | POA: Diagnosis not present

## 2023-05-16 NOTE — Patient Instructions (Signed)
Ok to take alleve OTC for pain as needed  Please continue all other medications as before, and refills have been done if requested.  Please have the pharmacy call with any other refills you may need.  Please keep your appointments with your specialists as you may have planned  Please go to the XRAY Department in the first floor for the x-ray testing  You will be contacted by phone if any changes need to be made immediately.  Otherwise, you will receive a letter about your results with an explanation, but please check with MyChart first.  Please make an Appointment to return in Jan 2025 as planned, or sooner if needed,

## 2023-05-16 NOTE — Progress Notes (Signed)
Patient ID: Carlos Boyd, male   DOB: 12-13-1980, 42 y.o.   MRN: [id number]        Chief Complaint: follow up left groin pain, obesity, htn, hyperglycemia,       HPI:  Carlos Boyd is a 42 y.o. male here with c/o 1 wk persistent worsening left groin pain, mild to mod intermittent without radiation, worse to walk, better to sit, started some time after has been really been working with exercise boot camp and lost much significant wt so far.  Pt denies chest pain, increased sob or doe, wheezing, orthopnea, PND, increased LE swelling, palpitations, dizziness or syncope.   Pt denies polydipsia, polyuria, or new focal neuro s/s.       Wt Readings from Last 3 Encounters:  05/16/23 252 lb (114.3 kg)  12/03/22 275 lb (124.7 kg)  08/06/22 (!) 317 lb (143.8 kg)   BP Readings from Last 3 Encounters:  05/16/23 (!) 148/84  12/03/22 138/82  08/06/22 134/76         Past Medical History:  Diagnosis Date   Morbid obesity (HCC) 06/02/2017   Past Surgical History:  Procedure Laterality Date   right arthroscopy knee     [place], [place]   WISDOM TOOTH EXTRACTION      reports that he has never smoked. He has never used smokeless tobacco. He reports current alcohol use of about 1.0 standard drink of alcohol per week. He reports that he does not use drugs. family history includes Diabetes in his maternal grandfather; Healthy in his father and mother. No Known Allergies Current Outpatient Medications on File Prior to Visit  Medication Sig Dispense Refill   cholecalciferol (VITAMIN D3) 25 MCG (1000 UNIT) tablet Take 1,000 Units by mouth daily.     cyanocobalamin (VITAMIN B12) 1000 MCG tablet Take 1,000 mcg by mouth daily.     sertraline (ZOLOFT) 100 MG tablet Take 1 tablet (100 mg total) by mouth daily. 90 tablet 3   triamcinolone cream (KENALOG) 0.1 % Apply 1 Application topically 2 (two) times daily. 80 g 0   No current facility-administered medications on file prior to visit.        ROS:  All  others reviewed and negative.  Objective        PE:  BP (!) 148/84 (BP Location: Left Arm, Patient Position: Sitting, Cuff Size: Normal)   Pulse 63   Temp 98.6 F (37 C) (Oral)   Ht 5' 10.5" (1.791 m)   Wt 252 lb (114.3 kg)   SpO2 98%   BMI 35.65 kg/m                 Constitutional: Pt appears in NAD               HENT: Head: NCAT.                Right Ear: External ear normal.                 Left Ear: External ear normal.                Eyes: . Pupils are equal, round, and reactive to light. Conjunctivae and EOM are normal               Nose: without d/c or deformity               Neck: Neck supple. Gross normal ROM  Cardiovascular: Normal rate and regular rhythm.                 Pulmonary/Chest: Effort normal and breath sounds without rales or wheezing.                Abd:  Soft, NT, ND, + BS, no organomegaly               Neurological: Pt is alert. At baseline orientation, motor grossly intact; left hip with reduced rom and discomfort with internal rotation               Skin: Skin is warm. No rashes, no other new lesions, LE edema - none               Psychiatric: Pt behavior is normal without agitation   Micro: none  Cardiac tracings I have personally interpreted today:  none  Pertinent Radiological findings (summarize): none   Lab Results  Component Value Date   WBC 5.8 08/06/2022   HGB 14.3 08/06/2022   HCT 41.8 08/06/2022   PLT 264.0 08/06/2022   GLUCOSE 89 08/06/2022   CHOL 135 08/06/2022   TRIG 63.0 08/06/2022   HDL 40.20 08/06/2022   LDLCALC 82 08/06/2022   ALT 61 (H) 08/06/2022   AST 68 (H) 08/06/2022   NA 141 08/06/2022   K 4.9 08/06/2022   CL 103 08/06/2022   CREATININE 0.90 08/06/2022   BUN 15 08/06/2022   CO2 28 08/06/2022   TSH 1.74 08/06/2022   PSA 0.25 08/06/2022   HGBA1C 5.3 08/06/2022   Assessment/Plan:  Carlos Boyd is a 42 y.o. White or Caucasian [1] male with  has a past medical history of Morbid obesity (HCC)  (06/02/2017).  Morbid obesity with BMI of 40.0-44.9, adult (HCC) To continue excercsie after hip improved   Left groin pain I suspect underlying djd, less likely labral tear, for left hip xray, consider mri if not improving with rest  Hyperglycemia Lab Results  Component Value Date   HGBA1C 5.3 08/06/2022   Stable, pt to continue current medical treatment  - diet, wt control  Elevated blood pressure reading without diagnosis of hypertension BP Readings from Last 3 Encounters:  05/16/23 (!) 148/84  12/03/22 138/82  08/06/22 134/76   Uncontrolled, likely reactive, pt to continue efforts at wt loss, diet, excercise  Followup: Return if symptoms worsen or fail to improve.  Carlos Barre, MD 05/19/2023 7:14 PM [facility]

## 2023-05-19 ENCOUNTER — Encounter: Payer: Self-pay | Admitting: Internal Medicine

## 2023-05-19 DIAGNOSIS — R1032 Left lower quadrant pain: Secondary | ICD-10-CM | POA: Insufficient documentation

## 2023-05-19 NOTE — Assessment & Plan Note (Signed)
I suspect underlying djd, less likely labral tear, for left hip xray, consider mri if not improving with rest

## 2023-05-19 NOTE — Assessment & Plan Note (Signed)
To continue excercsie after hip improved

## 2023-05-19 NOTE — Assessment & Plan Note (Signed)
BP Readings from Last 3 Encounters:  05/16/23 (!) 148/84  12/03/22 138/82  08/06/22 134/76   Uncontrolled, likely reactive, pt to continue efforts at wt loss, diet, excercise

## 2023-05-19 NOTE — Assessment & Plan Note (Signed)
Lab Results  Component Value Date   HGBA1C 5.3 08/06/2022   Stable, pt to continue current medical treatment  - diet, wt control

## 2023-05-29 NOTE — Progress Notes (Unsigned)
   Rubin Payor, PhD, LAT, ATC acting as a scribe for Clementeen Graham, MD.  Carlos Boyd is a 42 y.o. male who presents to [facility] today for L groin pain ongoing since mid-Oct. Pt locates pain to ***. Pain seems to be worse after activity.  Radiates: Aggravates: Treatments tried:  Dx imaging: 05/16/23 Bilat hips/pelvis  Pertinent review of systems: ***  Relevant historical information: ***   Exam:  There were no vitals taken for this visit. General: Well Developed, well nourished, and in no acute distress.   MSK: ***    Lab and Radiology Results No results found for this or any previous visit (from the past 72 hour(s)). No results found.     Assessment and Plan: 42 y.o. male with ***   PDMP not reviewed this encounter. No orders of the defined types were placed in this encounter.  No orders of the defined types were placed in this encounter.    Discussed warning signs or symptoms. Please see discharge instructions. Patient expresses understanding.   ***

## 2023-05-30 ENCOUNTER — Other Ambulatory Visit: Payer: Self-pay

## 2023-05-30 ENCOUNTER — Ambulatory Visit: Payer: BC Managed Care – PPO | Admitting: Family Medicine

## 2023-05-30 VITALS — BP 152/96 | HR 63 | Ht 70.5 in | Wt 254.0 lb

## 2023-05-30 DIAGNOSIS — R1032 Left lower quadrant pain: Secondary | ICD-10-CM | POA: Diagnosis not present

## 2023-05-30 NOTE — Patient Instructions (Addendum)
Thank you for coming in today.   I've referred you to Physical Therapy.  Let us know if you don't hear from them in one week.   Check back in 6-8 weeks

## 2023-06-10 ENCOUNTER — Ambulatory Visit (HOSPITAL_BASED_OUTPATIENT_CLINIC_OR_DEPARTMENT_OTHER)
Admission: RE | Admit: 2023-06-10 | Discharge: 2023-06-10 | Disposition: A | Discharge status: Home / Self Care | Payer: BC Managed Care – PPO | Source: Ambulatory Visit | Attending: Family Medicine | Admitting: Family Medicine

## 2023-06-10 ENCOUNTER — Encounter: Payer: Self-pay | Admitting: Family Medicine

## 2023-06-10 ENCOUNTER — Ambulatory Visit: Payer: BC Managed Care – PPO | Admitting: Family Medicine

## 2023-06-10 ENCOUNTER — Other Ambulatory Visit: Payer: Self-pay | Admitting: Family Medicine

## 2023-06-10 VITALS — BP 138/80 | HR 72 | Temp 98.5°F | Resp 20 | Ht 70.5 in | Wt 264.0 lb

## 2023-06-10 DIAGNOSIS — R1032 Left lower quadrant pain: Secondary | ICD-10-CM

## 2023-06-10 DIAGNOSIS — K4090 Unilateral inguinal hernia, without obstruction or gangrene, not specified as recurrent: Secondary | ICD-10-CM

## 2023-06-10 HISTORY — DX: Unilateral inguinal hernia, without obstruction or gangrene, not specified as recurrent: K40.90

## 2023-06-10 NOTE — Progress Notes (Signed)
Assessment & Plan:  1. Left groin pain STAT pelvic ultrasound ordered to assess for hernia. - US Pelvis Complete; Future   Follow up plan: Return if symptoms worsen or fail to improve.  Deliah Boston, MSN, APRN, FNP-C  Subjective:  HPI: Carlos Boyd is a 42 y.o. male presenting on 06/10/2023 for left groin pain (Getting worse ? Hernia )  Patient is accompanied by his wife, who he is okay with being present.  Patient presents with left groin pain that started in the middle of October.  He was seen by his PCP on 05/16/2023 at which time an x-ray was ordered, which was negative.  He was then seen by sports medicine on 05/30/2023 at which time it was felt to be a muscle strain.  Physical therapy was ordered.  If no improvement would consider MRI.  Recommended follow-up in 6 weeks.  Physical therapy starts tomorrow. Patient reports his pain has increased and has been more frequent. Previously only felt the pain after doing activity, but is now feeling pain constantly. Describes the pain as pressure, hard knot, and bulging sensation that he can reduce. Greater pain sensation when he needs to have a bowel movement or has a lot of gas.     ROS: Negative unless specifically indicated above in HPI.   Relevant past medical history reviewed and updated as indicated.   Allergies and medications reviewed and updated.   Current Outpatient Medications:    cholecalciferol (VITAMIN D3) 25 MCG (1000 UNIT) tablet, Take 1,000 Units by mouth daily., Disp: , Rfl:    cyanocobalamin (VITAMIN B12) 1000 MCG tablet, Take 1,000 mcg by mouth daily., Disp: , Rfl:    minoxidil (ROGAINE) 2 % external solution, Apply topically daily., Disp: , Rfl:    sertraline (ZOLOFT) 100 MG tablet, Take 1 tablet (100 mg total) by mouth daily., Disp: 90 tablet, Rfl: 3   triamcinolone cream (KENALOG) 0.1 %, Apply 1 Application topically 2 (two) times daily. (Patient not taking: Reported on 06/10/2023), Disp: 80 g, Rfl: 0  No  Known Allergies  Objective:   BP 138/80   Pulse 72   Temp 98.5 F (36.9 C)   Resp 20   Ht 5' 10.5" (1.791 m)   Wt 264 lb (119.7 kg)   BMI 37.34 kg/m    Physical Exam Vitals reviewed.  Constitutional:      General: He is not in acute distress.    Appearance: Normal appearance. He is not ill-appearing, toxic-appearing or diaphoretic.  HENT:     Head: Normocephalic and atraumatic.  Eyes:     General: No scleral icterus.       Right eye: No discharge.        Left eye: No discharge.     Conjunctiva/sclera: Conjunctivae normal.  Cardiovascular:     Rate and Rhythm: Normal rate.  Pulmonary:     Effort: Pulmonary effort is normal. No respiratory distress.  Abdominal:     Comments: + left groin pain with palpation  Musculoskeletal:        General: Normal range of motion.     Cervical back: Normal range of motion.     Left hip: Normal.  Skin:    General: Skin is warm and dry.  Neurological:     Mental Status: He is alert and oriented to person, place, and time. Mental status is at baseline.  Psychiatric:        Mood and Affect: Mood normal.        Behavior:  Behavior normal.        Thought Content: Thought content normal.        Judgment: Judgment normal.

## 2023-06-10 NOTE — Therapy (Unsigned)
OUTPATIENT PHYSICAL THERAPY LOWER EXTREMITY EVALUATION   Patient Name: Carlos Boyd MRN: [id number] DOB:06/17/1981, 42 y.o., male Today's Date: 06/11/2023  END OF SESSION:  PT End of Session - 06/11/23 0751     Visit Number 1    Number of Visits 16    Date for PT Re-Evaluation 09/03/23    PT Start Time 0800    PT Stop Time 0843    PT Time Calculation (min) 43 min    Activity Tolerance Patient limited by pain    Behavior During Therapy [facility] for tasks assessed/performed             Past Medical History:  Diagnosis Date   Left inguinal hernia 06/10/2023   Morbid obesity (HCC) 06/02/2017   Past Surgical History:  Procedure Laterality Date   right arthroscopy knee     [place], [place]   WISDOM TOOTH EXTRACTION     Patient Active Problem List   Diagnosis Date Noted   Left inguinal hernia 06/10/2023   Left groin pain 05/19/2023   Anxiety 04/22/2023   Premature ejaculation, acquired, generalized, severe 03/14/2022   B12 deficiency 06/23/2021   Encounter for well adult exam with abnormal findings 06/19/2021   Scalp irritation 06/19/2021   Vitamin D deficiency 06/12/2020   Hyperglycemia 06/04/2019   Exposure to COVID-19 virus 02/25/2019   Low back pain 02/25/2019   Tinea cruris 02/01/2016   Routine general medical examination at a health care facility 11/29/2014   Rash and nonspecific skin eruption 11/29/2014   Elevated blood pressure reading without diagnosis of hypertension 11/04/2014   Morbid obesity with BMI of 40.0-44.9, adult (HCC) 11/04/2014   Rotator cuff injury, left, initial encounter 11/04/2014    PCP: Corwin Levins, MD  REFERRING PROVIDER: Rodolph Bong, MD  REFERRING DIAG: R10.32 (ICD-10-CM) - Left groin pain  THERAPY DIAG:  Left groin pain  Muscle weakness (generalized)  Rationale for Evaluation and Treatment: Rehabilitation  ONSET DATE: mid Oct 2024  SUBJECTIVE:   SUBJECTIVE STATEMENT: States initially he had pain in hip groin warm and  burning sensation that would show up after working out. Went to to MD did not detect  sports hernia. Pain increased in frequency and with just standing and cooking dinner.  He had switch from rock box to burn [employer]. He has been resting since pain started from working out. Reports he went out of town last week. Pain continued to get worse. Now it's in his upper groin and now having inguinal pain that radiates into scrotum.  Had [place] yesterday that showed hernia.  Lots 65# this year and used to have joint pain prior to losing weight.  PERTINENT HISTORY: R meniscal repair, vasectomy last year, HTN  PAIN:  Are you having pain? Yes: NPRS scale: 6/10 Pain location: left  Pain description: constant Aggravating factors: holding breath, bearing down Relieving factors: rest  PRECAUTIONS: Other: valsalva  RED FLAGS: None   WEIGHT BEARING RESTRICTIONS: No  FALLS:  Has patient fallen in last 6 months? 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  DIAGNOSTIC FINDINGS:  [place] 06/10/23 IMPRESSION: 1. Findings suggestive of a small left inguinal hernia containing bowel. 2. Mildly prominent, although not pathologically enlarged left inguinal lymph nodes, likely reactive.  PATIENT SURVEYS:  FOTO 33%  COGNITION: Overall cognitive  status: Within functional limits for tasks assessed     SENSATION: Not tested  EDEMA:  None observed    POSTURE: rounded shoulders, forward head, posterior pelvic tilt, and flexed trunk      Lumbar  AROM:    EVAL     Flexion 50% limited      Extension 50% limited^      R ROT       L ROT       R SB 50% limited *     L SB 50% limited     * Pain   ^ hinges at L4/5  (Blank rows = not tested)   LE Measurements Lower Extremity Right EVAL  Left EVAL   A/PROM MMT A/PROM MMT  Hip Flexion  4+ * 4-  Hip Extension      Hip Abduction   *   Hip Adduction      Hip Internal rotation      Hip External rotation      Knee Flexion  4  3+*  Knee Extension  4+  4  Ankle Dorsiflexion      Ankle Plantarflexion      Ankle Inversion      Ankle Eversion       (Blank rows = not tested) * pain   LOWER EXTREMITY SPECIAL TESTS/breathing assessment:  Breathes anterior and upper chest predominantly minimal diaphragmatic excursion noted with long and or exhale.  Stomach protrudes with head lift  FUNCTIONAL TESTS:  Pain with supine to sit  GAIT: Distance walked: 25 ft in clinic Assistive device utilized: None Level of assistance: Complete Independence Comments: wide BOS, limited hip IR/ER   TODAY'S TREATMENT:                                                                                                                              DATE:   06/11/2023  Therapeutic Exercise:  Aerobic: Supine: scapular protraction with exhale and hug for protraction with inhale  Prone:  Seated:  Standing: Neuromuscular Re-education: long exhale breathing in decompressive position (2 pillows under pelvis)  Manual Therapy: Therapeutic Activity: Self Care: on lymph massage daily- how to perform and why, on wearing compression shorts to help with pain Trigger Point Dry Needling:  Modalities:    PATIENT EDUCATION:  Education details: on current presentation, on HEP, on clinical outcomes score and POC Person educated: Patient Education method: Programmer, multimedia, Demonstration, and Handouts Education comprehension: verbalized understanding   HOME EXERCISE PROGRAM: Breathing exercises and lymph massage  ASSESSMENT:  CLINICAL IMPRESSION: Patient presents to physical therapy with complaints of left inguinal pain primarily with exertion and breath holds.  Pain has progressed over the last couple weeks from groin to upper inguinal area.  Recent  ultrasound demonstrates small inguinal hernia at this time.  Educated patient current presentation as well as benefits of low pressure fitness and breathing mechanics.  Patient would greatly benefit from skilled PT to improve overall function  and quality of life and reduce advancement of hernia.  OBJECTIVE IMPAIRMENTS: decreased activity tolerance, decreased mobility, difficulty walking, decreased ROM, decreased strength, improper body mechanics, postural dysfunction, and pain.   ACTIVITY LIMITATIONS: lifting, sitting, squatting, bed mobility, and locomotion level  PARTICIPATION LIMITATIONS: meal prep, cleaning, community activity, and occupation  PERSONAL FACTORS: Fitness are also affecting patient's functional outcome.   REHAB POTENTIAL: Good  CLINICAL DECISION MAKING: Stable/uncomplicated  EVALUATION COMPLEXITY: Low   GOALS: Goals reviewed with patient? yes  SHORT TERM GOALS: Target date: 07/23/2023  Patient will be independent in self management strategies to improve quality of life and functional outcomes. Baseline: New Program Goal status: INITIAL  2.  Patient will report at least 50% improvement in overall symptoms and/or function to demonstrate improved functional mobility Baseline: 0% better Goal status: INITIAL  3.  Patient will be able to demonstrate good breathing mechanics with long exhale and inhale focusing on lower rib mechanics instead of anterior upper chest Baseline: Primarily breathing with anterior upper chest Goal status: INITIAL  4.  Patient will demonstrate pain-free range of motion in lumbar spine and left hip Baseline: Painful Goal status: INITIAL    LONG TERM GOALS: Target date: 09/03/2023  Patient will report at least 75% improvement in overall symptoms and/or function to demonstrate improved functional mobility Baseline: 0% better Goal status: INITIAL  2.  Patient will improve score on FOTO outcomes measure to projected score to demonstrate  overall improved function and QOL Baseline: see above Goal status: INITIAL  3.  Patient will be able to perform exercise routine at burn boot camp without increase in pain to return to prior level of function Baseline: Unable painful Goal status: INITIAL     PLAN:  PT FREQUENCY: 1-2x/week for total of 16 visits over 12 weeks certification.  PT DURATION: 12 weeks  PLANNED INTERVENTIONS: 97110-Therapeutic exercises, 97530- Therapeutic activity, [id number]- Neuromuscular re-education, [phone]- Self Care, 72536- Manual therapy, [phone]- Gait training, [phone]- Orthotic Fit/training, [phone]- Canalith repositioning, [phone]- Aquatic Therapy, 97014- Electrical stimulation (unattended), [phone]- Ionotophoresis 4mg /ml Dexamethasone, Patient/Family education, Balance training, Stair training, Taping, Dry Needling, Joint mobilization, Joint manipulation, Spinal manipulation, Spinal mobilization, Cryotherapy, and Moist heat   PLAN FOR NEXT SESSION: Low pressure fitness and breath work, range of motion and isolated muscle activation, core strength, avoid Valsalva maneuver, safe lifting mechanics when able   10:02 AM, 06/11/23 [name], DPT Physical Therapy with [facility]

## 2023-06-11 ENCOUNTER — Encounter: Payer: Self-pay | Admitting: Family Medicine

## 2023-06-11 ENCOUNTER — Ambulatory Visit: Payer: BC Managed Care – PPO | Admitting: Physical Therapy

## 2023-06-11 ENCOUNTER — Encounter: Payer: Self-pay | Admitting: Physical Therapy

## 2023-06-11 DIAGNOSIS — R1032 Left lower quadrant pain: Secondary | ICD-10-CM | POA: Diagnosis not present

## 2023-06-11 DIAGNOSIS — M6281 Muscle weakness (generalized): Secondary | ICD-10-CM | POA: Diagnosis not present

## 2023-06-12 NOTE — Therapy (Unsigned)
OUTPATIENT PHYSICAL THERAPY LOWER EXTREMITY TREATMENT   Patient Name: Duwane Melchert MRN: [id number] DOB:12-14-1980, 42 y.o., male Today's Date: 06/13/2023  END OF SESSION:  PT End of Session - 06/13/23 0930     Visit Number 2    Number of Visits 16    Date for PT Re-Evaluation 09/03/23    PT Start Time 0931    PT Stop Time 1011    PT Time Calculation (min) 40 min    Activity Tolerance Patient limited by pain    Behavior During Therapy [facility] for tasks assessed/performed              Past Medical History:  Diagnosis Date   Left inguinal hernia 06/10/2023   Morbid obesity (HCC) 06/02/2017   Past Surgical History:  Procedure Laterality Date   right arthroscopy knee     [place], [place]   WISDOM TOOTH EXTRACTION     Patient Active Problem List   Diagnosis Date Noted   Left inguinal hernia 06/10/2023   Left groin pain 05/19/2023   Anxiety 04/22/2023   Premature ejaculation, acquired, generalized, severe 03/14/2022   B12 deficiency 06/23/2021   Encounter for well adult exam with abnormal findings 06/19/2021   Scalp irritation 06/19/2021   Vitamin D deficiency 06/12/2020   Hyperglycemia 06/04/2019   Exposure to COVID-19 virus 02/25/2019   Low back pain 02/25/2019   Tinea cruris 02/01/2016   Routine general medical examination at a health care facility 11/29/2014   Rash and nonspecific skin eruption 11/29/2014   Elevated blood pressure reading without diagnosis of hypertension 11/04/2014   Morbid obesity with BMI of 40.0-44.9, adult (HCC) 11/04/2014   Rotator cuff injury, left, initial encounter 11/04/2014    PCP: Corwin Levins, MD  REFERRING PROVIDER: Rodolph Bong, MD  REFERRING DIAG: R10.32 (ICD-10-CM) - Left groin pain  THERAPY DIAG:  Left groin pain  Muscle weakness (generalized)  Rationale for Evaluation and Treatment: Rehabilitation  ONSET DATE: mid Oct 2024  SUBJECTIVE:   SUBJECTIVE STATEMENT: 06/13/2023 States that the lymph drainage has been  very helpful and helps with sharp pain. States the breathing exercises do not make it worse or change his symptoms. States he has noticed lifting his daughter is provoking his symptoms. States he is unsure compression shorts are helping. States standing and sitting are making his pain worse. States he sits on an office chair at home and typically sits with his left leg under his right leg.   EVAL: States initially he had pain in hip groin warm and burning sensation that would show up after working out. Went to to MD did not detect  sports hernia. Pain increased in frequency and with just standing and cooking dinner.  He had switch from rock box to burn boot camp. He has been resting since pain started from working out. Reports he went out of town last week. Pain continued to get worse. Now it's in his upper groin and now having inguinal pain that radiates into scrotum.  Had [place] yesterday that showed hernia.  Lots 65# this year and used to have joint pain prior to losing weight.  PERTINENT HISTORY: R meniscal repair, vasectomy last year, HTN  PAIN:  Are you having pain? Yes: NPRS scale: 2/10 Pain location: left  Pain description: constant Aggravating factors: holding breath, bearing down Relieving factors: rest  PRECAUTIONS: Other: valsalva  RED FLAGS: None   WEIGHT BEARING RESTRICTIONS: No  FALLS:  Has patient fallen in last 6 months? 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  DIAGNOSTIC FINDINGS:  [place] 06/10/23 IMPRESSION: 1. Findings suggestive of a small left inguinal hernia containing bowel. 2. Mildly prominent, although not pathologically enlarged left inguinal lymph nodes, likely reactive.  PATIENT SURVEYS:  FOTO 33%  COGNITION: Overall cognitive status: Within  functional limits for tasks assessed     SENSATION: Not tested  EDEMA:  None observed    POSTURE: rounded shoulders, forward head, posterior pelvic tilt, and flexed trunk      Lumbar  AROM:    EVAL     Flexion 50% limited      Extension 50% limited^      R ROT       L ROT       R SB 50% limited *     L SB 50% limited     * Pain   ^ hinges at L4/5  (Blank rows = not tested)   LE Measurements Lower Extremity Right EVAL Left EVAL   A/PROM MMT A/PROM MMT  Hip Flexion  4+ * 4-  Hip Extension      Hip Abduction   *   Hip Adduction      Hip Internal rotation      Hip External rotation      Knee Flexion  4  3+*  Knee Extension  4+  4  Ankle Dorsiflexion      Ankle Plantarflexion      Ankle Inversion      Ankle Eversion       (Blank rows = not tested) * pain   LOWER EXTREMITY SPECIAL TESTS/breathing assessment:  Breathes anterior and upper chest predominantly minimal diaphragmatic excursion noted with long and or exhale.  Stomach protrudes with head lift  FUNCTIONAL TESTS:  Pain with supine to sit  GAIT: Distance walked: 25 ft in clinic Assistive device utilized: None Level of assistance: Complete Independence Comments: wide BOS, limited hip IR/ER   TODAY'S TREATMENT:                                                                                                                              DATE:   06/13/2023  Therapeutic Exercise:  Aerobic: Supine: educated and practiced gentle self massage to abdominals Prone:  Seated:  Standing: Neuromuscular Re-education: on posture and position at work (home office) - strategies to reduce pressure on hernia with standing and sitting.- practiced while in clinic, long exhale breathing 5 minutes Manual Therapy:STM to iliacus B, abdominals B - tolerated well - right more tender than left Therapeutic Activity: Self Care:  Trigger Point Dry Needling:  Modalities:    PATIENT EDUCATION:  Education details: on   posture, self massage, on checking in with posture throughout the day Person educated: Patient Education  method: Explanation, Demonstration, and Handouts Education comprehension: verbalized understanding   HOME EXERCISE PROGRAM: Breathing exercises and lymph massage  ASSESSMENT:  CLINICAL IMPRESSION: 06/13/2023 Session focused on review of breathing exercises and education and discussion about positioning to alleviate pressure and symptoms along hernia area.  Tolerated ergonomic set up well with reduction in pain noted with proper support and set up.  Discussed things to look for when sitting and to stop talking left leg on the right leg while sitting for long periods of time.  Performed soft tissue mobilization to abdominals and iliac [place] muscle.  Tolerated mildly well with surprisingly increased tenderness and pain on the right side.  Some pain did refer to groin region but a completely resolved after pressure was abolished.  Educated patient in gentle massage and not going into pain.  Overall patient doing well reporting significantly less pain end of session.  Will continue to monitor pending MD follow-up tomorrow.  Will continue as appropriate with plan of care.  EVAL: Patient presents to physical therapy with complaints of left inguinal pain primarily with exertion and breath holds.  Pain has progressed over the last couple weeks from groin to upper inguinal area.  Recent ultrasound demonstrates small inguinal hernia at this time.  Educated patient current presentation as well as benefits of low pressure fitness and breathing mechanics.  Patient would greatly benefit from skilled PT to improve overall function and quality of life and reduce advancement of hernia.  OBJECTIVE IMPAIRMENTS: decreased activity tolerance, decreased mobility, difficulty walking, decreased ROM, decreased strength, improper body mechanics, postural dysfunction, and pain.   ACTIVITY LIMITATIONS: lifting, sitting,  squatting, bed mobility, and locomotion level  PARTICIPATION LIMITATIONS: meal prep, cleaning, community activity, and occupation  PERSONAL FACTORS: Fitness are also affecting patient's functional outcome.   REHAB POTENTIAL: Good  CLINICAL DECISION MAKING: Stable/uncomplicated  EVALUATION COMPLEXITY: Low   GOALS: Goals reviewed with patient? yes  SHORT TERM GOALS: Target date: 07/23/2023  Patient will be independent in self management strategies to improve quality of life and functional outcomes. Baseline: New Program Goal status: INITIAL  2.  Patient will report at least 50% improvement in overall symptoms and/or function to demonstrate improved functional mobility Baseline: 0% better Goal status: INITIAL  3.  Patient will be able to demonstrate good breathing mechanics with long exhale and inhale focusing on lower rib mechanics instead of anterior upper chest Baseline: Primarily breathing with anterior upper chest Goal status: INITIAL  4.  Patient will demonstrate pain-free range of motion in lumbar spine and left hip Baseline: Painful Goal status: INITIAL    LONG TERM GOALS: Target date: 09/03/2023  Patient will report at least 75% improvement in overall symptoms and/or function to demonstrate improved functional mobility Baseline: 0% better Goal status: INITIAL  2.  Patient will improve score on FOTO outcomes measure to projected score to demonstrate overall improved function and QOL Baseline: see above Goal status: INITIAL  3.  Patient will be able to perform exercise routine at burn boot camp without increase in pain to return to prior level of function Baseline: Unable painful Goal status: INITIAL     PLAN:  PT FREQUENCY: 1-2x/week for total of 16 visits over 12 weeks certification.  PT DURATION: 12 weeks  PLANNED INTERVENTIONS: 97110-Therapeutic exercises, 97530- Therapeutic activity, [id number]- Neuromuscular re-education, 97535- Self Care, [id number]- Manual  therapy, [phone]- Gait training, [phone]- Orthotic Fit/training, [phone]- Canalith repositioning, [id number]- Aquatic Therapy, 97014- Electrical stimulation (unattended), [phone]- Ionotophoresis 4mg /ml Dexamethasone, Patient/Family education, Balance training, Stair training,  Taping, Dry Needling, Joint mobilization, Joint manipulation, Spinal manipulation, Spinal mobilization, Cryotherapy, and Moist heat   PLAN FOR NEXT SESSION: Low pressure fitness and breath work, range of motion and isolated muscle activation, core strength, avoid Valsalva maneuver, safe lifting mechanics when able   12:41 PM, 06/13/23 [name], DPT Physical Therapy with [facility]

## 2023-06-13 ENCOUNTER — Ambulatory Visit: Payer: BC Managed Care – PPO | Admitting: Physical Therapy

## 2023-06-13 ENCOUNTER — Encounter: Payer: Self-pay | Admitting: Physical Therapy

## 2023-06-13 DIAGNOSIS — R1032 Left lower quadrant pain: Secondary | ICD-10-CM | POA: Diagnosis not present

## 2023-06-13 DIAGNOSIS — M6281 Muscle weakness (generalized): Secondary | ICD-10-CM | POA: Diagnosis not present

## 2023-06-14 ENCOUNTER — Ambulatory Visit: Payer: Self-pay | Admitting: General Surgery

## 2023-06-14 DIAGNOSIS — K4090 Unilateral inguinal hernia, without obstruction or gangrene, not specified as recurrent: Secondary | ICD-10-CM | POA: Diagnosis not present

## 2023-06-18 ENCOUNTER — Ambulatory Visit: Payer: BC Managed Care – PPO | Admitting: Physical Therapy

## 2023-06-18 ENCOUNTER — Encounter: Payer: Self-pay | Admitting: Physical Therapy

## 2023-06-18 DIAGNOSIS — R1032 Left lower quadrant pain: Secondary | ICD-10-CM | POA: Diagnosis not present

## 2023-06-18 DIAGNOSIS — M6281 Muscle weakness (generalized): Secondary | ICD-10-CM | POA: Diagnosis not present

## 2023-06-18 NOTE — Therapy (Signed)
OUTPATIENT PHYSICAL THERAPY LOWER EXTREMITY TREATMENT   Patient Name: Carlos Boyd MRN: [id number] DOB:10/14/1980, 42 y.o., male Today's Date: 06/18/2023  END OF SESSION:  PT End of Session - 06/18/23 0850     Visit Number 3    Number of Visits 16    Date for PT Re-Evaluation 09/03/23    PT Start Time 0851    PT Stop Time 0929    PT Time Calculation (min) 38 min    Activity Tolerance Patient limited by pain    Behavior During Therapy [facility] for tasks assessed/performed              Past Medical History:  Diagnosis Date   Left inguinal hernia 06/10/2023   Morbid obesity (HCC) 06/02/2017   Past Surgical History:  Procedure Laterality Date   right arthroscopy knee     [place], [place]   WISDOM TOOTH EXTRACTION     Patient Active Problem List   Diagnosis Date Noted   Left inguinal hernia 06/10/2023   Left groin pain 05/19/2023   Anxiety 04/22/2023   Premature ejaculation, acquired, generalized, severe 03/14/2022   B12 deficiency 06/23/2021   Encounter for well adult exam with abnormal findings 06/19/2021   Scalp irritation 06/19/2021   Vitamin D deficiency 06/12/2020   Hyperglycemia 06/04/2019   Exposure to COVID-19 virus 02/25/2019   Low back pain 02/25/2019   Tinea cruris 02/01/2016   Routine general medical examination at a health care facility 11/29/2014   Rash and nonspecific skin eruption 11/29/2014   Elevated blood pressure reading without diagnosis of hypertension 11/04/2014   Morbid obesity with BMI of 40.0-44.9, adult (HCC) 11/04/2014   Rotator cuff injury, left, initial encounter 11/04/2014    PCP: Corwin Levins, MD  REFERRING PROVIDER: Rodolph Bong, MD  REFERRING DIAG: R10.32 (ICD-10-CM) - Left groin pain  THERAPY DIAG:  Left groin pain  Muscle weakness (generalized)  Rationale for Evaluation and Treatment: Rehabilitation  ONSET DATE: mid Oct 2024  SUBJECTIVE:   SUBJECTIVE STATEMENT: 06/18/2023 States he saw the surgeon having  surgery on 12/13. Having a lumbar support pillow at work and sitting more comfortably at work. States he is getting a hernia truss.  Having good days and bad days. Massage is helping. States he does his breathing when he can.   EVAL: States initially he had pain in hip groin warm and burning sensation that would show up after working out. Went to to MD did not detect  sports hernia. Pain increased in frequency and with just standing and cooking dinner.  He had switch from rock box to burn boot camp. He has been resting since pain started from working out. Reports he went out of town last week. Pain continued to get worse. Now it's in his upper groin and now having inguinal pain that radiates into scrotum.  Had [place] yesterday that showed hernia.  Lots 65# this year and used to have joint pain prior to losing weight.  PERTINENT HISTORY: R meniscal repair, vasectomy last year, HTN  PAIN:  Are you having pain? Yes: NPRS scale: 3/10 Pain location: left  Pain description: constant Aggravating factors: holding breath, bearing down Relieving factors: rest  PRECAUTIONS: Other: valsalva  RED FLAGS: None   WEIGHT BEARING RESTRICTIONS: No  FALLS:  Has patient fallen in last 6 months? 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  DIAGNOSTIC FINDINGS:  [place] 06/10/23 IMPRESSION: 1. Findings suggestive of a small left inguinal hernia containing bowel. 2. Mildly prominent, although not pathologically enlarged left inguinal lymph nodes, likely reactive.  PATIENT SURVEYS:  FOTO 33%  COGNITION: Overall cognitive status: Within functional limits for tasks assessed     SENSATION: Not tested  EDEMA:  None observed    POSTURE: rounded shoulders, forward head, posterior pelvic tilt, and  flexed trunk      Lumbar  AROM:    EVAL     Flexion 50% limited      Extension 50% limited^      R ROT       L ROT       R SB 50% limited *     L SB 50% limited     * Pain   ^ hinges at L4/5  (Blank rows = not tested)   LE Measurements Lower Extremity Right EVAL Left EVAL   A/PROM MMT A/PROM MMT  Hip Flexion  4+ * 4-  Hip Extension      Hip Abduction   *   Hip Adduction      Hip Internal rotation      Hip External rotation      Knee Flexion  4  3+*  Knee Extension  4+  4  Ankle Dorsiflexion      Ankle Plantarflexion      Ankle Inversion      Ankle Eversion       (Blank rows = not tested) * pain   LOWER EXTREMITY SPECIAL TESTS/breathing assessment:  Breathes anterior and upper chest predominantly minimal diaphragmatic excursion noted with long and or exhale.  Stomach protrudes with head lift  FUNCTIONAL TESTS:  Pain with supine to sit  GAIT: Distance walked: 25 ft in clinic Assistive device utilized: None Level of assistance: Complete Independence Comments: wide BOS, limited hip IR/ER   TODAY'S TREATMENT:                                                                                                                              DATE:   06/18/2023  Therapeutic Exercise:  Aerobic: Supine: educated and practiced gentle self massage to abdominals- brainstormed strategies to get deeper with fingers, educated on vacuum technique with video demonstration and rationale for intervention  Prone:  Seated:  Standing:shoulder flexion decompression x5  Neuromuscular Re-education: long exhale breathing 4  minutes, lateral costal breathing with scapular protraction and hips elevated 8 minutes Manual Therapy:STM to iliacus B, abdominals B - tolerated well - right more tender than left Therapeutic Activity: Self Care:  Trigger Point Dry Needling:  Modalities:    PATIENT EDUCATION:  Education details: on HEP  Person educated: Patient Education method: Programmer, multimedia,  Facilities manager, and Handouts Education comprehension: verbalized understanding   HOME EXERCISE PROGRAM: Breathing exercises and lymph massage  ASSESSMENT:  CLINICAL IMPRESSION: 06/18/2023 Continue to  progress all exercises and answered all questions on this date.  Overall patient is doing much better but continues to have inconsistencies of pain.  Tolerates breath work well as well as positioning to alleviate symptoms.  Surgery is scheduled for mid December and will continue with current plan of care as tolerated to reduce stress and pain.  EVAL: Patient presents to physical therapy with complaints of left inguinal pain primarily with exertion and breath holds.  Pain has progressed over the last couple weeks from groin to upper inguinal area.  Recent ultrasound demonstrates small inguinal hernia at this time.  Educated patient current presentation as well as benefits of low pressure fitness and breathing mechanics.  Patient would greatly benefit from skilled PT to improve overall function and quality of life and reduce advancement of hernia.  OBJECTIVE IMPAIRMENTS: decreased activity tolerance, decreased mobility, difficulty walking, decreased ROM, decreased strength, improper body mechanics, postural dysfunction, and pain.   ACTIVITY LIMITATIONS: lifting, sitting, squatting, bed mobility, and locomotion level  PARTICIPATION LIMITATIONS: meal prep, cleaning, community activity, and occupation  PERSONAL FACTORS: Fitness are also affecting patient's functional outcome.   REHAB POTENTIAL: Good  CLINICAL DECISION MAKING: Stable/uncomplicated  EVALUATION COMPLEXITY: Low   GOALS: Goals reviewed with patient? yes  SHORT TERM GOALS: Target date: 07/23/2023  Patient will be independent in self management strategies to improve quality of life and functional outcomes. Baseline: New Program Goal status: INITIAL  2.  Patient will report at least 50% improvement in overall symptoms and/or  function to demonstrate improved functional mobility Baseline: 0% better Goal status: INITIAL  3.  Patient will be able to demonstrate good breathing mechanics with long exhale and inhale focusing on lower rib mechanics instead of anterior upper chest Baseline: Primarily breathing with anterior upper chest Goal status: INITIAL  4.  Patient will demonstrate pain-free range of motion in lumbar spine and left hip Baseline: Painful Goal status: INITIAL    LONG TERM GOALS: Target date: 09/03/2023  Patient will report at least 75% improvement in overall symptoms and/or function to demonstrate improved functional mobility Baseline: 0% better Goal status: INITIAL  2.  Patient will improve score on FOTO outcomes measure to projected score to demonstrate overall improved function and QOL Baseline: see above Goal status: INITIAL  3.  Patient will be able to perform exercise routine at burn boot camp without increase in pain to return to prior level of function Baseline: Unable painful Goal status: INITIAL     PLAN:  PT FREQUENCY: 1-2x/week for total of 16 visits over 12 weeks certification.  PT DURATION: 12 weeks  PLANNED INTERVENTIONS: 97110-Therapeutic exercises, 97530- Therapeutic activity, [id number]- Neuromuscular re-education, [phone]- Self Care, 10175- Manual therapy, [phone]- Gait training, [phone]- Orthotic Fit/training, [phone]- Canalith repositioning, [phone]- Aquatic Therapy, 97014- Electrical stimulation (unattended), [phone]- Ionotophoresis 4mg /ml Dexamethasone, Patient/Family education, Balance training, Stair training, Taping, Dry Needling, Joint mobilization, Joint manipulation, Spinal manipulation, Spinal mobilization, Cryotherapy, and Moist heat   PLAN FOR NEXT SESSION: Low pressure fitness and breath work, range of motion and isolated muscle activation, core strength, avoid Valsalva maneuver, safe lifting mechanics when able   9:59 AM, 06/18/23 [name], DPT Physical Therapy  with [facility]

## 2023-06-25 ENCOUNTER — Ambulatory Visit: Payer: BC Managed Care – PPO | Admitting: Physical Therapy

## 2023-06-25 ENCOUNTER — Encounter: Payer: Self-pay | Admitting: Physical Therapy

## 2023-06-25 DIAGNOSIS — R1032 Left lower quadrant pain: Secondary | ICD-10-CM | POA: Diagnosis not present

## 2023-06-25 DIAGNOSIS — M6281 Muscle weakness (generalized): Secondary | ICD-10-CM

## 2023-06-25 NOTE — Therapy (Signed)
OUTPATIENT PHYSICAL THERAPY LOWER EXTREMITY TREATMENT   Patient Name: Carlos Boyd MRN: [id number] DOB:08/20/1980, 42 y.o., male Today's Date: 06/25/2023  END OF SESSION:  PT End of Session - 06/25/23 0847     Visit Number 4    Number of Visits 16    Date for PT Re-Evaluation 09/03/23    PT Start Time 0847    PT Stop Time 0926    PT Time Calculation (min) 39 min    Activity Tolerance Patient limited by pain    Behavior During Therapy [facility] for tasks assessed/performed              Past Medical History:  Diagnosis Date   Left inguinal hernia 06/10/2023   Morbid obesity (HCC) 06/02/2017   Past Surgical History:  Procedure Laterality Date   right arthroscopy knee     [place], [place]   WISDOM TOOTH EXTRACTION     Patient Active Problem List   Diagnosis Date Noted   Left inguinal hernia 06/10/2023   Left groin pain 05/19/2023   Anxiety 04/22/2023   Premature ejaculation, acquired, generalized, severe 03/14/2022   B12 deficiency 06/23/2021   Encounter for well adult exam with abnormal findings 06/19/2021   Scalp irritation 06/19/2021   Vitamin D deficiency 06/12/2020   Hyperglycemia 06/04/2019   Exposure to COVID-19 virus 02/25/2019   Low back pain 02/25/2019   Tinea cruris 02/01/2016   Routine general medical examination at a health care facility 11/29/2014   Rash and nonspecific skin eruption 11/29/2014   Elevated blood pressure reading without diagnosis of hypertension 11/04/2014   Morbid obesity with BMI of 40.0-44.9, adult (HCC) 11/04/2014   Rotator cuff injury, left, initial encounter 11/04/2014    PCP: Corwin Levins, MD  REFERRING PROVIDER: Rodolph Bong, MD  REFERRING DIAG: R10.32 (ICD-10-CM) - Left groin pain  THERAPY DIAG:  Left groin pain  Muscle weakness (generalized)  Rationale for Evaluation and Treatment: Rehabilitation  ONSET DATE: mid Oct 2024  SUBJECTIVE:   SUBJECTIVE STATEMENT: 06/25/2023 States pain overall better. He did have a  day over the weekend where it was really bad but on a hole it good. States he has been wearing a hernia truss and that seems to help. Exercises are going well and massage is still helping.   EVAL: States initially he had pain in hip groin warm and burning sensation that would show up after working out. Went to to MD did not detect  sports hernia. Pain increased in frequency and with just standing and cooking dinner.  He had switch from rock box to burn boot camp. He has been resting since pain started from working out. Reports he went out of town last week. Pain continued to get worse. Now it's in his upper groin and now having inguinal pain that radiates into scrotum.  Had [place] yesterday that showed hernia.  Lots 65# this year and used to have joint pain prior to losing weight.  PERTINENT HISTORY: R meniscal repair, vasectomy last year, HTN  PAIN:  Are you having pain? Yes: NPRS scale: 2/10 Pain location: left  Pain description: constant Aggravating factors: holding breath, bearing down Relieving factors: rest  PRECAUTIONS: Other: valsalva  RED FLAGS: None   WEIGHT BEARING RESTRICTIONS: No  FALLS:  Has patient fallen in last 6 months? 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  DIAGNOSTIC FINDINGS:  [place] 06/10/23 IMPRESSION: 1. Findings suggestive of a small left inguinal hernia containing bowel. 2. Mildly prominent, although not pathologically enlarged left inguinal lymph nodes, likely reactive.  PATIENT SURVEYS:  FOTO 33%  COGNITION: Overall cognitive status: Within functional limits for tasks assessed     SENSATION: Not tested  EDEMA:  None observed    POSTURE: rounded shoulders, forward head, posterior pelvic tilt, and flexed trunk      Lumbar  AROM:     EVAL     Flexion 50% limited      Extension 50% limited^      R ROT       L ROT       R SB 50% limited *     L SB 50% limited     * Pain   ^ hinges at L4/5  (Blank rows = not tested)   LE Measurements Lower Extremity Right EVAL Left EVAL   A/PROM MMT A/PROM MMT  Hip Flexion  4+ * 4-  Hip Extension      Hip Abduction   *   Hip Adduction      Hip Internal rotation      Hip External rotation      Knee Flexion  4  3+*  Knee Extension  4+  4  Ankle Dorsiflexion      Ankle Plantarflexion      Ankle Inversion      Ankle Eversion       (Blank rows = not tested) * pain   LOWER EXTREMITY SPECIAL TESTS/breathing assessment:  Breathes anterior and upper chest predominantly minimal diaphragmatic excursion noted with long and or exhale.  Stomach protrudes with head lift  FUNCTIONAL TESTS:  Pain with supine to sit  GAIT: Distance walked: 25 ft in clinic Assistive device utilized: None Level of assistance: Complete Independence Comments: wide BOS, limited hip IR/ER   TODAY'S TREATMENT:                                                                                                                              DATE:   06/25/2023  Therapeutic Exercise:  Aerobic: Supine: self massage to hernia for pain relief 5 minutes Prone:  Seated:  Standing: Neuromuscular Re-education: long exhale breathing 2  minutes, lateral costal breathing with scapular protraction and hips elevated 5 minutes, box breathing 8 minutes- 4 then 5 second holds/breathes, 3 rounds breathing with apnea and vacuum on third round-  10 minutes, maya position length wise breathing 8 minutes Manual Therapy:  Therapeutic Activity: Self Care:  Trigger Point Dry Needling:  Modalities:     PATIENT EDUCATION:  Education details: on HEP Person educated: Patient Education method: Programmer, multimedia, Facilities manager, and Handouts Education comprehension: verbalized understanding   HOME EXERCISE PROGRAM: Breathing  exercises and lymph massage Maya position, box breathing, lateral costal breathing, long exhale breathing  ASSESSMENT:  CLINICAL IMPRESSION:  06/25/2023 Continued to progress all exercises. Added box breathing which was tolerated well. Added 3 rounds of breathing then with vacuum- initially tolerated well but with true seal pain noted. Returned to self massage to help with this and this helped return pain down to baseline. Instructed patient on vacuum with less intensity. This was tolerated slightly better. Did not add vacuum to HEP secondary to inconsistency with pain, did add box breathing to HEP as this was tolerated well. Maya position also tolerated well with reduction in pain noted afterwards, added this position to HEP. Will continue with current POC as tolerated.   EVAL: Patient presents to physical therapy with complaints of left inguinal pain primarily with exertion and breath holds.  Pain has progressed over the last couple weeks from groin to upper inguinal area.  Recent ultrasound demonstrates small inguinal hernia at this time.  Educated patient current presentation as well as benefits of low pressure fitness and breathing mechanics.  Patient would greatly benefit from skilled PT to improve overall function and quality of life and reduce advancement of hernia.  OBJECTIVE IMPAIRMENTS: decreased activity tolerance, decreased mobility, difficulty walking, decreased ROM, decreased strength, improper body mechanics, postural dysfunction, and pain.   ACTIVITY LIMITATIONS: lifting, sitting, squatting, bed mobility, and locomotion level  PARTICIPATION LIMITATIONS: meal prep, cleaning, community activity, and occupation  PERSONAL FACTORS: Fitness are also affecting patient's functional outcome.   REHAB POTENTIAL: Good  CLINICAL DECISION MAKING: Stable/uncomplicated  EVALUATION COMPLEXITY: Low   GOALS: Goals reviewed with patient? yes  SHORT TERM GOALS: Target date: 07/23/2023   Patient will be independent in self management strategies to improve quality of life and functional outcomes. Baseline: New Program Goal status: INITIAL  2.  Patient will report at least 50% improvement in overall symptoms and/or function to demonstrate improved functional mobility Baseline: 0% better Goal status: INITIAL  3.  Patient will be able to demonstrate good breathing mechanics with long exhale and inhale focusing on lower rib mechanics instead of anterior upper chest Baseline: Primarily breathing with anterior upper chest Goal status: INITIAL  4.  Patient will demonstrate pain-free range of motion in lumbar spine and left hip Baseline: Painful Goal status: INITIAL    LONG TERM GOALS: Target date: 09/03/2023  Patient will report at least 75% improvement in overall symptoms and/or function to demonstrate improved functional mobility Baseline: 0% better Goal status: INITIAL  2.  Patient will improve score on FOTO outcomes measure to projected score to demonstrate overall improved function and QOL Baseline: see above Goal status: INITIAL  3.  Patient will be able to perform exercise routine at burn boot camp without increase in pain to return to prior level of function Baseline: Unable painful Goal status: INITIAL     PLAN:  PT FREQUENCY: 1-2x/week for total of 16 visits over 12 weeks certification.  PT DURATION: 12 weeks  PLANNED INTERVENTIONS: 97110-Therapeutic exercises, 97530- Therapeutic activity, [id number]- Neuromuscular re-education, [phone]- Self Care, 91478- Manual therapy, [phone]- Gait training, [phone]- Orthotic Fit/training, [phone]- Canalith repositioning, [phone]- Aquatic Therapy, 97014- Electrical stimulation (unattended), [phone]- Ionotophoresis 4mg /ml Dexamethasone, Patient/Family education, Balance training, Stair training, Taping, Dry Needling, Joint mobilization, Joint manipulation, Spinal manipulation, Spinal mobilization, Cryotherapy, and Moist heat   PLAN FOR  NEXT SESSION: trial maya, Low pressure fitness and breath work, range of motion and isolated muscle activation, core strength, avoid Valsalva maneuver, safe lifting mechanics when able   9:30 AM, 06/25/23 [name], DPT Physical Therapy with [facility]

## 2023-06-25 NOTE — Patient Instructions (Signed)
 -  press elbows and forearms into floor -think about lengthening the spine on the inhale and maintaining that length on the exhale -press your heels away from your knees to help create length -head should be lightly supported on floor not pressed into floor

## 2023-06-27 ENCOUNTER — Ambulatory Visit: Payer: BC Managed Care – PPO | Admitting: Physical Therapy

## 2023-06-27 ENCOUNTER — Encounter: Payer: Self-pay | Admitting: Physical Therapy

## 2023-06-27 DIAGNOSIS — R1032 Left lower quadrant pain: Secondary | ICD-10-CM | POA: Diagnosis not present

## 2023-06-27 DIAGNOSIS — M6281 Muscle weakness (generalized): Secondary | ICD-10-CM | POA: Diagnosis not present

## 2023-06-27 NOTE — Patient Instructions (Signed)
DUE TO COVID-19 ONLY TWO VISITORS  (aged 44 and older)  ARE ALLOWED TO COME WITH YOU AND STAY IN THE WAITING ROOM ONLY DURING PRE OP AND PROCEDURE.   **NO VISITORS ARE ALLOWED IN THE SHORT STAY AREA OR RECOVERY ROOM!!**  IF YOU WILL BE ADMITTED INTO THE HOSPITAL YOU ARE ALLOWED ONLY FOUR SUPPORT PEOPLE DURING VISITATION HOURS ONLY (7 AM -8PM)   The support person(s) must pass our screening, gel in and out, and wear a mask at all times, including in the patient's room. Patients must also wear a mask when staff or their support person are in the room. Visitors GUEST BADGE MUST BE WORN VISIBLY  One adult visitor may remain with you overnight and MUST be in the room by 8 P.M.     Your procedure is scheduled on: 07/05/23   Report to [facility] Main Entrance    Report to admitting at : 11:45 AM   Call this number if you have problems the morning of surgery [phone]   Eat a light diet the day before surgery.  Examples including soups, broths, toast, yogurt, mashed potatoes.  Things to avoid include carbonated beverages (fizzy beverages), raw fruits and raw vegetables, or beans.   If your bowels are filled with gas, your surgeon will have difficulty visualizing your pelvic organs which increases your surgical risks.  Do not eat food :After Midnight.   After Midnight you may have the following liquids until: 11:00 AM DAY OF SURGERY  Water Black Coffee (sugar ok, NO MILK/CREAM OR CREAMERS)  Tea (sugar ok, NO MILK/CREAM OR CREAMERS) regular and decaf                             Plain Jell-O (NO RED)                                           Fruit ices (not with fruit pulp, NO RED)                                     Popsicles (NO RED)                                                                  Juice: apple, WHITE grape, WHITE cranberry Sports drinks like Gatorade (NO RED)               FOLLOW ANY ADDITIONAL PRE OP INSTRUCTIONS YOU RECEIVED FROM YOUR SURGEON'S OFFICE!!!    Oral Hygiene is also important to reduce your risk of infection.                                    Remember - BRUSH YOUR TEETH THE MORNING OF SURGERY WITH YOUR REGULAR TOOTHPASTE  DENTURES WILL BE REMOVED PRIOR TO SURGERY PLEASE DO NOT APPLY "Poly grip" OR ADHESIVES!!!   Do NOT smoke after Midnight   Take these medicines the morning of surgery with A SIP OF  WATER: sertraline.Tylenol as needed.                              You may not have any metal on your body including hair pins, jewelry, and body piercing             Do not wear lotions, powders, perfumes/cologne, or deodorant              Men may shave face and neck.   Do not bring valuables to the hospital. [facility] IS NOT             RESPONSIBLE   FOR VALUABLES.   Contacts, glasses, or bridgework may not be worn into surgery.   Bring small overnight bag day of surgery.   DO NOT BRING YOUR HOME MEDICATIONS TO THE HOSPITAL. PHARMACY WILL DISPENSE MEDICATIONS LISTED ON YOUR MEDICATION LIST TO YOU DURING YOUR ADMISSION IN THE HOSPITAL!    Patients discharged on the day of surgery will not be allowed to drive home.  Someone NEEDS to stay with you for the first 24 hours after anesthesia.   Special Instructions: Bring a copy of your healthcare power of attorney and living will documents         the day of surgery if you haven't scanned them before.              Please read over the following fact sheets you were given: IF YOU HAVE QUESTIONS ABOUT YOUR PRE-OP INSTRUCTIONS PLEASE CALL [phone]    [facility] - Preparing for Surgery Before surgery, you can play an important role.  Because skin is not sterile, your skin needs to be as free of germs as possible.  You can reduce the number of germs on your skin by washing with CHG (chlorahexidine gluconate) soap before surgery.  CHG is an antiseptic cleaner which kills germs and bonds with the skin to continue killing germs even after washing. Please DO NOT use if you have an  allergy to CHG or antibacterial soaps.  If your skin becomes reddened/irritated stop using the CHG and inform your nurse when you arrive at Short Stay. Do not shave (including legs and underarms) for at least 48 hours prior to the first CHG shower.  You may shave your face/neck. Please follow these instructions carefully:  1.  Shower with CHG Soap the night before surgery and the  morning of Surgery.  2.  If you choose to wash your hair, wash your hair first as usual with your  normal  shampoo.  3.  After you shampoo, rinse your hair and body thoroughly to remove the  shampoo.                           4.  Use CHG as you would any other liquid soap.  You can apply chg directly  to the skin and wash                       Gently with a scrungie or clean washcloth.  5.  Apply the CHG Soap to your body ONLY FROM THE NECK DOWN.   Do not use on face/ open                           Wound or open sores. Avoid contact with eyes, ears mouth and  genitals (private parts).                       Wash face,  Genitals (private parts) with your normal soap.             6.  Wash thoroughly, paying special attention to the area where your surgery  will be performed.  7.  Thoroughly rinse your body with warm water from the neck down.  8.  DO NOT shower/wash with your normal soap after using and rinsing off  the CHG Soap.                9.  Pat yourself dry with a clean towel.            10.  Wear clean pajamas.            11.  Place clean sheets on your bed the night of your first shower and do not  sleep with pets. Day of Surgery : Do not apply any lotions/deodorants the morning of surgery.  Please wear clean clothes to the hospital/surgery center.  FAILURE TO FOLLOW THESE INSTRUCTIONS MAY RESULT IN THE CANCELLATION OF YOUR SURGERY PATIENT SIGNATURE_________________________________  NURSE SIGNATURE__________________________________  ________________________________________________________________________

## 2023-06-27 NOTE — Therapy (Signed)
OUTPATIENT PHYSICAL THERAPY LOWER EXTREMITY TREATMENT   Patient Name: Carlos Boyd MRN: [id number] DOB:1980-10-13, 42 y.o., male Today's Date: 06/27/2023  END OF SESSION:  PT End of Session - 06/27/23 0943     Visit Number 5    Number of Visits 16    Date for PT Re-Evaluation 09/03/23    PT Start Time 0940    PT Stop Time 1018    PT Time Calculation (min) 38 min    Activity Tolerance Patient limited by pain    Behavior During Therapy [facility] for tasks assessed/performed               Past Medical History:  Diagnosis Date   Left inguinal hernia 06/10/2023   Morbid obesity (HCC) 06/02/2017   Past Surgical History:  Procedure Laterality Date   right arthroscopy knee     [place], [place]   WISDOM TOOTH EXTRACTION     Patient Active Problem List   Diagnosis Date Noted   Left inguinal hernia 06/10/2023   Left groin pain 05/19/2023   Anxiety 04/22/2023   Premature ejaculation, acquired, generalized, severe 03/14/2022   B12 deficiency 06/23/2021   Encounter for well adult exam with abnormal findings 06/19/2021   Scalp irritation 06/19/2021   Vitamin D deficiency 06/12/2020   Hyperglycemia 06/04/2019   Exposure to COVID-19 virus 02/25/2019   Low back pain 02/25/2019   Tinea cruris 02/01/2016   Routine general medical examination at a health care facility 11/29/2014   Rash and nonspecific skin eruption 11/29/2014   Elevated blood pressure reading without diagnosis of hypertension 11/04/2014   Morbid obesity with BMI of 40.0-44.9, adult (HCC) 11/04/2014   Rotator cuff injury, left, initial encounter 11/04/2014    PCP: Corwin Levins, MD  REFERRING PROVIDER: Rodolph Bong, MD  REFERRING DIAG: R10.32 (ICD-10-CM) - Left groin pain  THERAPY DIAG:  Left groin pain  Muscle weakness (generalized)  Rationale for Evaluation and Treatment: Rehabilitation  ONSET DATE: mid Oct 2024  SUBJECTIVE:   SUBJECTIVE STATEMENT: 06/27/2023 States he has had some pain and thinks  the hernia truss has been uncomfortable on his contralateral side. States that the   EVAL: States initially he had pain in hip groin warm and burning sensation that would show up after working out. Went to to MD did not detect  sports hernia. Pain increased in frequency and with just standing and cooking dinner.  He had switch from rock box to burn [employer]. He has been resting since pain started from working out. Reports he went out of town last week. Pain continued to get worse. Now it's in his upper groin and now having inguinal pain that radiates into scrotum.  Had [place] yesterday that showed hernia.  Lots 65# this year and used to have joint pain prior to losing weight.  PERTINENT HISTORY: R meniscal repair, vasectomy last year, HTN  PAIN:  Are you having pain? Yes: NPRS scale: 2/10 Pain location: left  Pain description: constant Aggravating factors: holding breath, bearing down Relieving factors: rest  PRECAUTIONS: Other: valsalva  RED FLAGS: None   WEIGHT BEARING RESTRICTIONS: No  FALLS:  Has patient fallen in last 6 months? 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  DIAGNOSTIC FINDINGS:  [place] 06/10/23 IMPRESSION: 1. Findings suggestive of a small left  inguinal hernia containing bowel. 2. Mildly prominent, although not pathologically enlarged left inguinal lymph nodes, likely reactive.  PATIENT SURVEYS:  FOTO 33%  COGNITION: Overall cognitive status: Within functional limits for tasks assessed     SENSATION: Not tested  EDEMA:  None observed    POSTURE: rounded shoulders, forward head, posterior pelvic tilt, and flexed trunk      Lumbar  AROM:    EVAL     Flexion 50% limited      Extension 50% limited^      R ROT       L ROT       R SB 50%  limited *     L SB 50% limited     * Pain   ^ hinges at L4/5  (Blank rows = not tested)   LE Measurements Lower Extremity Right EVAL Left EVAL   A/PROM MMT A/PROM MMT  Hip Flexion  4+ * 4-  Hip Extension      Hip Abduction   *   Hip Adduction      Hip Internal rotation      Hip External rotation      Knee Flexion  4  3+*  Knee Extension  4+  4  Ankle Dorsiflexion      Ankle Plantarflexion      Ankle Inversion      Ankle Eversion       (Blank rows = not tested) * pain   LOWER EXTREMITY SPECIAL TESTS/breathing assessment:  Breathes anterior and upper chest predominantly minimal diaphragmatic excursion noted with long and or exhale.  Stomach protrudes with head lift  FUNCTIONAL TESTS:  Pain with supine to sit  GAIT: Distance walked: 25 ft in clinic Assistive device utilized: None Level of assistance: Complete Independence Comments: wide BOS, limited hip IR/ER   TODAY'S TREATMENT:                                                                                                                              DATE:   06/27/2023  Therapeutic Exercise:  Aerobic: Supine:  long exhale breathing in [place] and supine with hips elevated position as pain reduction 10 minutes total, trailed verus heights for passive pelvic elevation and breathing 10 minutes, self vibration and vibration education 10 minutes Prone:  Seated:  Standing: Neuromuscular Re-education:  Manual Therapy: vibration to foam pad while patient in hook lying (foam under pelvis)- tolerated well 8 minutes Therapeutic Activity: Self Care:  Trigger Point Dry Needling:  Modalities:     PATIENT EDUCATION:  Education details: on HEP, on safe use of vibration therapy as indirect treatment method.  Person educated: Patient Education method: Explanation, Demonstration, and Handouts Education comprehension: verbalized understanding   HOME EXERCISE PROGRAM: Breathing exercises and lymph massage Maya position, box  breathing, lateral costal breathing, long exhale breathing  ASSESSMENT:  CLINICAL IMPRESSION: 06/27/2023 Increase in pain noted on this date. Did not initially tolerate elevated pelvis positions.  Tried vibration to medium for pelvic vibration and this helped significantly reporting reduced pain and improved tolerance to breathing postures and positions. Overall doing better by end of session. Educated in safe use of percussion and not to use without a medium and not to use directly on hernia or if painful. Will continue with current POC as tolerated.   EVAL: Patient presents to physical therapy with complaints of left inguinal pain primarily with exertion and breath holds.  Pain has progressed over the last couple weeks from groin to upper inguinal area.  Recent ultrasound demonstrates small inguinal hernia at this time.  Educated patient current presentation as well as benefits of low pressure fitness and breathing mechanics.  Patient would greatly benefit from skilled PT to improve overall function and quality of life and reduce advancement of hernia.  OBJECTIVE IMPAIRMENTS: decreased activity tolerance, decreased mobility, difficulty walking, decreased ROM, decreased strength, improper body mechanics, postural dysfunction, and pain.   ACTIVITY LIMITATIONS: lifting, sitting, squatting, bed mobility, and locomotion level  PARTICIPATION LIMITATIONS: meal prep, cleaning, community activity, and occupation  PERSONAL FACTORS: Fitness are also affecting patient's functional outcome.   REHAB POTENTIAL: Good  CLINICAL DECISION MAKING: Stable/uncomplicated  EVALUATION COMPLEXITY: Low   GOALS: Goals reviewed with patient? yes  SHORT TERM GOALS: Target date: 07/23/2023  Patient will be independent in self management strategies to improve quality of life and functional outcomes. Baseline: New Program Goal status: INITIAL  2.  Patient will report at least 50% improvement in overall symptoms  and/or function to demonstrate improved functional mobility Baseline: 0% better Goal status: INITIAL  3.  Patient will be able to demonstrate good breathing mechanics with long exhale and inhale focusing on lower rib mechanics instead of anterior upper chest Baseline: Primarily breathing with anterior upper chest Goal status: INITIAL  4.  Patient will demonstrate pain-free range of motion in lumbar spine and left hip Baseline: Painful Goal status: INITIAL    LONG TERM GOALS: Target date: 09/03/2023  Patient will report at least 75% improvement in overall symptoms and/or function to demonstrate improved functional mobility Baseline: 0% better Goal status: INITIAL  2.  Patient will improve score on FOTO outcomes measure to projected score to demonstrate overall improved function and QOL Baseline: see above Goal status: INITIAL  3.  Patient will be able to perform exercise routine at burn boot camp without increase in pain to return to prior level of function Baseline: Unable painful Goal status: INITIAL     PLAN:  PT FREQUENCY: 1-2x/week for total of 16 visits over 12 weeks certification.  PT DURATION: 12 weeks  PLANNED INTERVENTIONS: 97110-Therapeutic exercises, 97530- Therapeutic activity, [id number]- Neuromuscular re-education, [phone]- Self Care, 60454- Manual therapy, [phone]- Gait training, [phone]- Orthotic Fit/training, [phone]- Canalith repositioning, [phone]- Aquatic Therapy, 97014- Electrical stimulation (unattended), [phone]- Ionotophoresis 4mg /ml Dexamethasone, Patient/Family education, Balance training, Stair training, Taping, Dry Needling, Joint mobilization, Joint manipulation, Spinal manipulation, Spinal mobilization, Cryotherapy, and Moist heat   PLAN FOR NEXT SESSION: trial maya, Low pressure fitness and breath work, range of motion and isolated muscle activation, core strength, avoid Valsalva maneuver, safe lifting mechanics when able   10:20 AM, 06/27/23 [name],  DPT Physical Therapy with [facility] Lory

## 2023-06-28 ENCOUNTER — Encounter (HOSPITAL_COMMUNITY): Payer: Self-pay

## 2023-06-28 ENCOUNTER — Other Ambulatory Visit: Payer: Self-pay

## 2023-06-28 ENCOUNTER — Encounter (HOSPITAL_COMMUNITY)
Admission: RE | Admit: 2023-06-28 | Discharge: 2023-06-28 | Disposition: A | Discharge status: Home / Self Care | Payer: BC Managed Care – PPO | Source: Ambulatory Visit | Attending: General Surgery | Admitting: General Surgery

## 2023-06-28 VITALS — BP 160/112 | HR 64 | Temp 98.0°F | Ht 70.0 in | Wt 262.0 lb

## 2023-06-28 DIAGNOSIS — R030 Elevated blood-pressure reading, without diagnosis of hypertension: Secondary | ICD-10-CM | POA: Diagnosis not present

## 2023-06-28 DIAGNOSIS — Z01818 Encounter for other preprocedural examination: Secondary | ICD-10-CM | POA: Insufficient documentation

## 2023-06-28 DIAGNOSIS — Z01810 Encounter for preprocedural cardiovascular examination: Secondary | ICD-10-CM | POA: Diagnosis not present

## 2023-06-28 DIAGNOSIS — Z01812 Encounter for preprocedural laboratory examination: Secondary | ICD-10-CM | POA: Diagnosis not present

## 2023-06-28 LAB — CBC
HCT: 44.2 % (ref 39.0–52.0)
Hemoglobin: 15.1 g/dL (ref 13.0–17.0)
MCH: 30.6 pg (ref 26.0–34.0)
MCHC: 34.2 g/dL (ref 30.0–36.0)
MCV: 89.7 fL (ref 80.0–100.0)
Platelets: 226 10*3/uL (ref 150–400)
RBC: 4.93 MIL/uL (ref 4.22–5.81)
RDW: 12.5 % (ref 11.5–15.5)
WBC: 6.1 10*3/uL (ref 4.0–10.5)
nRBC: 0 % (ref 0.0–0.2)

## 2023-06-28 LAB — BASIC METABOLIC PANEL
Anion gap: 7 (ref 5–15)
BUN: 20 mg/dL (ref 6–20)
CO2: 26 mmol/L (ref 22–32)
Calcium: 9.1 mg/dL (ref 8.9–10.3)
Chloride: 107 mmol/L (ref 98–111)
Creatinine, Ser: 0.82 mg/dL (ref 0.61–1.24)
GFR, Estimated: >60 mL/min (ref >60)
Glucose, Bld: 87 mg/dL (ref 70–99)
Potassium: 4.5 mmol/L (ref 3.5–5.1)
Sodium: 140 mmol/L (ref 135–145)

## 2023-06-28 NOTE — Progress Notes (Signed)
For Anesthesia: PCP - Corwin Levins, MD  Cardiologist - N/A  Bowel Prep reminder:  Chest x-ray -  EKG - 06/28/23 Stress Test -  ECHO -  Cardiac Cath -  Pacemaker/ICD device last checked: Pacemaker orders received: Device Rep notified:  Spinal Cord Stimulator:N/A  Sleep Study - N/A CPAP -   Fasting Blood Sugar - N/A Checks Blood Sugar _____ times a day Date and result of last Hgb A1c-  Last dose of GLP1 agonist- N/A GLP1 instructions:   Last dose of SGLT-2 inhibitors- N/A SGLT-2 instructions:   Blood Thinner Instructions:N/A Aspirin Instructions: Last Dose:  Activity level: Can go up a flight of stairs and activities of daily living without stopping and without chest pain and/or shortness of breath   Able to exercise without chest pain and/or shortness of breath  Anesthesia review: BP elevated during PST appointment 153/108; manually: 160/112. Pt. Was advised to contact PCP about BP today.  Patient denies shortness of breath, fever, cough and chest pain at PAT appointment   Patient verbalized understanding of instructions that were given to them at the PAT appointment. Patient was also instructed that they will need to review over the PAT instructions again at home before surgery.

## 2023-07-01 ENCOUNTER — Encounter: Payer: Self-pay | Admitting: Physical Therapy

## 2023-07-01 ENCOUNTER — Ambulatory Visit: Payer: BC Managed Care – PPO | Admitting: Physical Therapy

## 2023-07-01 DIAGNOSIS — M6281 Muscle weakness (generalized): Secondary | ICD-10-CM | POA: Diagnosis not present

## 2023-07-01 DIAGNOSIS — R1032 Left lower quadrant pain: Secondary | ICD-10-CM

## 2023-07-01 NOTE — Therapy (Addendum)
 OUTPATIENT PHYSICAL THERAPY LOWER EXTREMITY TREATMENT PHYSICAL THERAPY DISCHARGE SUMMARY  Visits from Start of Care: 6  Current functional level related to goals / functional outcomes: Could not reassess due to unplanned discharged   Remaining deficits: Could not reassess due to unplanned discharged   Education / Equipment: Could not reassess due to unplanned discharged   Patient agrees to discharge. Patient goals were not met. Patient is being discharged due to not returning since the last visit.  2:40 PM, 10/15/23 [name], DPT Physical Therapy with [facility]    Patient Name: Carlos Boyd MRN: [id number] DOB:1981/07/23, 42 y.o., male Today's Date: 07/01/2023  END OF SESSION:  PT End of Session - 07/01/23 1332     Visit Number 6    Number of Visits 16    Date for PT Re-Evaluation 09/03/23    PT Start Time 1345    PT Stop Time 1425    PT Time Calculation (min) 40 min    Activity Tolerance Patient limited by pain    Behavior During Therapy [facility] for tasks assessed/performed               Past Medical History:  Diagnosis Date   Left inguinal hernia 06/10/2023   Morbid obesity (HCC) 06/02/2017   Past Surgical History:  Procedure Laterality Date   ARTHROSCOPY KNEE W/ DRILLING Right    right arthroscopy knee     [place], [place]   VASECTOMY  2023   WISDOM TOOTH EXTRACTION     Patient Active Problem List   Diagnosis Date Noted   Left inguinal hernia 06/10/2023   Left groin pain 05/19/2023   Anxiety 04/22/2023   Premature ejaculation, acquired, generalized, severe 03/14/2022   B12 deficiency 06/23/2021   Encounter for well adult exam with abnormal findings 06/19/2021   Scalp irritation 06/19/2021   Vitamin D deficiency 06/12/2020   Hyperglycemia 06/04/2019   Exposure to COVID-19 virus 02/25/2019   Low back pain 02/25/2019   Tinea cruris 02/01/2016   Routine general medical examination at a health care facility 11/29/2014   Rash and nonspecific  skin eruption 11/29/2014   Elevated blood pressure reading without diagnosis of hypertension 11/04/2014   Morbid obesity with BMI of 40.0-44.9, adult (HCC) 11/04/2014   Rotator cuff injury, left, initial encounter 11/04/2014    PCP: Corwin Levins, MD  REFERRING PROVIDER: Rodolph Bong, MD  REFERRING DIAG: R10.32 (ICD-10-CM) - Left groin pain  THERAPY DIAG:  Left groin pain  Muscle weakness (generalized)  Rationale for Evaluation and Treatment: Rehabilitation  ONSET DATE: mid Oct 2024  SUBJECTIVE:   SUBJECTIVE STATEMENT: 07/01/2023 States he has been having relatively low pain levels over the last couple days. States his BP was high at pre-op and he has been monitoring it. Reports overall he feels about 20-30% better since the start of PT. States got yoga blocks.  EVAL: States initially he had pain in hip groin warm and burning sensation that would show up after working out. Went to to MD did not detect  sports hernia. Pain increased in frequency and with just standing and cooking dinner.  He had switch from rock box to burn boot camp. He has been resting since pain started from working out. Reports he went out of town last week. Pain continued to get worse. Now it's in his upper groin and now having inguinal pain that radiates into scrotum.  Had [place] yesterday that showed hernia.  Lots 65# this year and used to have joint pain prior to  losing weight.  PERTINENT HISTORY: R meniscal repair, vasectomy last year, HTN  PAIN:  Are you having pain? Yes: NPRS scale: 1/10 Pain location: left  Pain description: constant Aggravating factors: holding breath, bearing down Relieving factors: rest  PRECAUTIONS: Other: valsalva  RED FLAGS: None   WEIGHT BEARING RESTRICTIONS: No  FALLS:  Has patient fallen in last 6 months? 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  DIAGNOSTIC FINDINGS:  [place] 06/10/23 IMPRESSION: 1. Findings suggestive of a small left inguinal hernia containing bowel. 2. Mildly prominent, although not pathologically enlarged left inguinal lymph nodes, likely reactive.  PATIENT SURVEYS:  FOTO 33%  COGNITION: Overall cognitive status: Within functional limits for tasks assessed     SENSATION: Not tested  EDEMA:  None observed    POSTURE: rounded shoulders, forward head, posterior pelvic tilt, and flexed trunk    Lumbar flexion - painful 07/01/23   LE Measurements Lower Extremity Right 07/01/23 Left 07/01/23   A/PROM MMT A/PROM MMT  Hip Flexion  4+  4  Hip Extension      Hip Abduction      Hip Adduction      Hip Internal rotation      Hip External rotation      Knee Flexion  4  4  Knee Extension  4+  4+  Ankle Dorsiflexion      Ankle Plantarflexion      Ankle Inversion      Ankle Eversion       (Blank rows = not tested) * pain    FUNCTIONAL TESTS:  Log rolling - no breath hold or pain noted on this date (12/9)  GAIT: Distance walked: 25 ft in clinic Assistive device utilized: None Level of assistance: Complete Independence Comments: wide BOS, limited hip IR/ER   TODAY'S TREATMENT:                                                                                                                              DATE:   07/01/2023  Therapeutic Exercise:  Objective measures updated : Supine:  hip ROM - IR/ADD still painful, hip ER in hook lying 3x10 B one at a time. One leg long stretch x5 R and x2 L 10" holds B, hip abd iso belt 2 minutes - worked up to 60% muscle  activation, hip add iso gentle - stopped   Quad: maya 3 minutes x2   Standing: Neuromuscular Re-education:  Manual Therapy:   Therapeutic Activity: Self Care:  Trigger Point Dry Needling:  Modalities:     PATIENT EDUCATION:  Education  details: on breathing strategies (box breathing) to reduce BP/stress Person educated: Patient Education method: Explanation, Demonstration, and Handouts Education comprehension: verbalized understanding   HOME EXERCISE PROGRAM: Breathing exercises  and lymph massage Maya position, box breathing, lateral costal breathing, long exhale breathing  ASSESSMENT:  CLINICAL IMPRESSION: 07/01/2023 Trailed different positions and exercises today to gently stretch hip muscles. Educated patient in breathing during exercises and not going into pain. Even light muscle activation of adductors was slightly painful so this was stopped. Added hip abd isometric and ROM to HEP secondary to good tolerance and reported that groin felt very tight and restricted. Patient to be placed on hold secondary to upcoming surgery. Answered all questions prior to end of session.  EVAL: Patient presents to physical therapy with complaints of left inguinal pain primarily with exertion and breath holds.  Pain has progressed over the last couple weeks from groin to upper inguinal area.  Recent ultrasound demonstrates small inguinal hernia at this time.  Educated patient current presentation as well as benefits of low pressure fitness and breathing mechanics.  Patient would greatly benefit from skilled PT to improve overall function and quality of life and reduce advancement of hernia.  OBJECTIVE IMPAIRMENTS: decreased activity tolerance, decreased mobility, difficulty walking, decreased ROM, decreased strength, improper body mechanics, postural dysfunction, and pain.   ACTIVITY LIMITATIONS: lifting, sitting, squatting, bed mobility, and locomotion level  PARTICIPATION LIMITATIONS: meal prep, cleaning, community activity, and occupation  PERSONAL FACTORS: Fitness are also affecting patient's functional outcome.   REHAB POTENTIAL: Good  CLINICAL DECISION MAKING: Stable/uncomplicated  EVALUATION COMPLEXITY: Low   GOALS: Goals  reviewed with patient? yes  SHORT TERM GOALS: Target date: 07/23/2023  Patient will be independent in self management strategies to improve quality of life and functional outcomes. Baseline: New Program Goal status: MET  2.  Patient will report at least 50% improvement in overall symptoms and/or function to demonstrate improved functional mobility Baseline: 0% better Goal status: PROGRESSING  3.  Patient will be able to demonstrate good breathing mechanics with long exhale and inhale focusing on lower rib mechanics instead of anterior upper chest Baseline: Primarily breathing with anterior upper chest Goal status: MET  4.  Patient will demonstrate pain-free range of motion in lumbar spine and left hip Baseline: Painful Goal status: PROGRESSING    LONG TERM GOALS: Target date: 09/03/2023  Patient will report at least 75% improvement in overall symptoms and/or function to demonstrate improved functional mobility Baseline: 0% better Goal status: PROGRESSING  2.  Patient will improve score on FOTO outcomes measure to projected score to demonstrate overall improved function and QOL Baseline: see above Goal status: PROGRESSING  3.  Patient will be able to perform exercise routine at burn boot camp without increase in pain to return to prior level of function Baseline: Unable painful Goal status: PROGRESSING     PLAN:  PT FREQUENCY: 1-2x/week for total of 16 visits over 12 weeks certification.  PT DURATION: 12 weeks  PLANNED INTERVENTIONS: 97110-Therapeutic exercises, 97530- Therapeutic activity, [id number]- Neuromuscular re-education, [phone]- Self Care, 60454- Manual therapy, [phone]- Gait training, [phone]- Orthotic Fit/training, [phone]- Canalith repositioning, [id number]- Aquatic Therapy, [id number]- Electrical stimulation (unattended), [phone]- Ionotophoresis 4mg /ml Dexamethasone, Patient/Family education, Balance training, Stair training, Taping, Dry Needling, Joint mobilization, Joint manipulation,  Spinal manipulation, Spinal mobilization, Cryotherapy, and Moist heat   PLAN FOR NEXT SESSION: patient placed on hold secondary to upcoming surgery - will f/u up in 4-6 weeks.    2:28 PM, 07/01/23 [name], DPT Physical Therapy with [facility]

## 2023-07-04 ENCOUNTER — Encounter (HOSPITAL_COMMUNITY): Payer: Self-pay | Admitting: General Surgery

## 2023-07-04 ENCOUNTER — Encounter: Payer: BC Managed Care – PPO | Admitting: Physical Therapy

## 2023-07-04 NOTE — Anesthesia Preprocedure Evaluation (Addendum)
Anesthesia Evaluation  Patient identified by MRN, date of birth, ID band Patient awake    Reviewed: Allergy & Precautions, NPO status , Patient's Chart, lab work & pertinent test results  Airway Mallampati: I  TM Distance: >3 FB Neck ROM: Full    Dental no notable dental hx. (+) Teeth Intact, Dental Advisory Given   Pulmonary neg pulmonary ROS   Pulmonary exam normal breath sounds clear to auscultation       Cardiovascular hypertension, Normal cardiovascular exam Rhythm:Regular Rate:Normal     Neuro/Psych    GI/Hepatic negative GI ROS, Neg liver ROS,,,  Endo/Other  negative endocrine ROS    Renal/GU Lab Results      Component                Value               Date                              K                        4.5                 06/28/2023                CO2                      26                  06/28/2023                BUN                      20                  06/28/2023                CREATININE               0.82                06/28/2023                     Musculoskeletal negative musculoskeletal ROS (+)    Abdominal  (+) + obese (BMI 37.6)  Peds  Hematology Lab Results      Component                Value               Date                      WBC                      6.1                 06/28/2023                HGB                      15.1                06/28/2023                HCT  44.2                06/28/2023                MCV                      89.7                06/28/2023                PLT                      226                 06/28/2023              Anesthesia Other Findings   Reproductive/Obstetrics                             Anesthesia Physical Anesthesia Plan  ASA: 2  Anesthesia Plan: General   Post-op Pain Management: Precedex and Tylenol PO (pre-op)*   Induction: Intravenous  PONV Risk Score and Plan: 3 and  Treatment may vary due to age or medical condition, Midazolam, Ondansetron and Dexamethasone  Airway Management Planned: Oral ETT  Additional Equipment: None  Intra-op Plan:   Post-operative Plan: Extubation in OR  Informed Consent: I have reviewed the patients History and Physical, chart, labs and discussed the procedure including the risks, benefits and alternatives for the proposed anesthesia with the patient or authorized representative who has indicated his/her understanding and acceptance.     Dental advisory given  Plan Discussed with:   Anesthesia Plan Comments:         Anesthesia Quick Evaluation

## 2023-07-05 ENCOUNTER — Other Ambulatory Visit (HOSPITAL_COMMUNITY): Payer: Self-pay

## 2023-07-05 ENCOUNTER — Ambulatory Visit (HOSPITAL_COMMUNITY)
Admission: RE | Admit: 2023-07-05 | Discharge: 2023-07-05 | Disposition: A | Discharge status: Home / Self Care | Payer: BC Managed Care – PPO | Attending: General Surgery | Admitting: General Surgery

## 2023-07-05 ENCOUNTER — Ambulatory Visit (HOSPITAL_COMMUNITY): Payer: BC Managed Care – PPO | Admitting: Physician Assistant

## 2023-07-05 ENCOUNTER — Ambulatory Visit (HOSPITAL_COMMUNITY): Payer: BC Managed Care – PPO | Admitting: Anesthesiology

## 2023-07-05 ENCOUNTER — Other Ambulatory Visit: Payer: Self-pay

## 2023-07-05 ENCOUNTER — Encounter (HOSPITAL_COMMUNITY)
Admission: RE | Disposition: A | Discharge status: Home / Self Care | Payer: Self-pay | Source: Home / Self Care | Attending: General Surgery

## 2023-07-05 ENCOUNTER — Encounter (HOSPITAL_COMMUNITY): Payer: Self-pay | Admitting: General Surgery

## 2023-07-05 DIAGNOSIS — K4090 Unilateral inguinal hernia, without obstruction or gangrene, not specified as recurrent: Secondary | ICD-10-CM | POA: Diagnosis not present

## 2023-07-05 DIAGNOSIS — I10 Essential (primary) hypertension: Secondary | ICD-10-CM | POA: Insufficient documentation

## 2023-07-05 SURGERY — HERNIORRHAPHY, INGUINAL, ROBOT-ASSISTED, LAPAROSCOPIC
Anesthesia: General | Laterality: Left

## 2023-07-05 MED ORDER — SODIUM CHLORIDE 0.9% FLUSH: 3.0000 mL | Freq: Two times a day (BID) | INTRAVENOUS | Status: DC

## 2023-07-05 MED ORDER — GABAPENTIN 300 MG PO CAPS
300.0000 mg | ORAL_CAPSULE | ORAL | Status: AC
  Administered 2023-07-05: 300 mg via ORAL
  Filled 2023-07-05: qty 1

## 2023-07-05 MED ORDER — BUPIVACAINE LIPOSOME 1.3 % IJ SUSP: 20.0000 mL | Freq: Once | INTRAMUSCULAR | Status: DC

## 2023-07-05 MED ORDER — MORPHINE SULFATE (PF) 2 MG/ML IV SOLN: 2.0000 mg | INTRAVENOUS | Status: DC | PRN

## 2023-07-05 MED ORDER — SODIUM CHLORIDE 0.9% FLUSH: 3.0000 mL | INTRAVENOUS | Status: DC | PRN

## 2023-07-05 MED ORDER — DEXAMETHASONE SODIUM PHOSPHATE 10 MG/ML IJ SOLN
INTRAMUSCULAR | Status: DC | PRN
  Administered 2023-07-05: 10 mg via INTRAVENOUS

## 2023-07-05 MED ORDER — IBUPROFEN 200 MG PO TABS
600.0000 mg | ORAL_TABLET | Freq: Four times a day (QID) | ORAL | 0 refills | Status: AC
  Filled 2023-07-05: qty 100, 9d supply, fill #0

## 2023-07-05 MED ORDER — OXYCODONE HCL 5 MG PO TABS
5.0000 mg | ORAL_TABLET | Freq: Once | ORAL | Status: AC | PRN
  Administered 2023-07-05: 5 mg via ORAL

## 2023-07-05 MED ORDER — PROPOFOL 10 MG/ML IV BOLUS
INTRAVENOUS | Status: AC
  Filled 2023-07-05: qty 20

## 2023-07-05 MED ORDER — SUGAMMADEX SODIUM 200 MG/2ML IV SOLN
INTRAVENOUS | Status: DC | PRN
  Administered 2023-07-05: 400 mg via INTRAVENOUS

## 2023-07-05 MED ORDER — KETAMINE HCL 10 MG/ML IJ SOLN
INTRAMUSCULAR | Status: DC | PRN
  Administered 2023-07-05: 30 mg via INTRAVENOUS
  Administered 2023-07-05: 20 mg via INTRAVENOUS

## 2023-07-05 MED ORDER — FENTANYL CITRATE (PF) 100 MCG/2ML IJ SOLN
INTRAMUSCULAR | Status: AC
  Filled 2023-07-05: qty 2

## 2023-07-05 MED ORDER — ACETAMINOPHEN 325 MG PO TABS
650.0000 mg | ORAL_TABLET | ORAL | Status: DC | PRN
Start: 2023-07-05 — End: 2023-07-06

## 2023-07-05 MED ORDER — LACTATED RINGERS IV SOLN: INTRAVENOUS | Status: DC

## 2023-07-05 MED ORDER — ROCURONIUM BROMIDE 10 MG/ML (PF) SYRINGE
PREFILLED_SYRINGE | INTRAVENOUS | Status: DC | PRN
  Administered 2023-07-05: 60 mg via INTRAVENOUS
  Administered 2023-07-05 (×2): 20 mg via INTRAVENOUS
  Administered 2023-07-05: 10 mg via INTRAVENOUS
  Administered 2023-07-05: 30 mg via INTRAVENOUS

## 2023-07-05 MED ORDER — KETAMINE HCL 50 MG/5ML IJ SOSY
PREFILLED_SYRINGE | INTRAMUSCULAR | Status: AC
  Filled 2023-07-05: qty 5

## 2023-07-05 MED ORDER — ACETAMINOPHEN 500 MG PO TABS: 1000.0000 mg | ORAL_TABLET | Freq: Four times a day (QID) | ORAL | 0 refills | Status: AC | PRN

## 2023-07-05 MED ORDER — HYDRALAZINE HCL 20 MG/ML IJ SOLN: 10.0000 mg | Freq: Once | INTRAMUSCULAR | Status: DC

## 2023-07-05 MED ORDER — ONDANSETRON HCL 4 MG/2ML IJ SOLN
INTRAMUSCULAR | Status: DC | PRN
  Administered 2023-07-05: 4 mg via INTRAVENOUS

## 2023-07-05 MED ORDER — ACETAMINOPHEN 325 MG PO TABS
650.0000 mg | ORAL_TABLET | Freq: Four times a day (QID) | ORAL | 0 refills | Status: AC
Start: 2023-07-05 — End: 2023-07-11

## 2023-07-05 MED ORDER — OXYCODONE HCL 5 MG/5ML PO SOLN
ORAL | Status: AC
  Filled 2023-07-05: qty 5

## 2023-07-05 MED ORDER — LIDOCAINE 2% (20 MG/ML) 5 ML SYRINGE
INTRAMUSCULAR | Status: DC | PRN
  Administered 2023-07-05: 80 mg via INTRAVENOUS

## 2023-07-05 MED ORDER — ROCURONIUM BROMIDE 10 MG/ML (PF) SYRINGE
PREFILLED_SYRINGE | INTRAVENOUS | Status: AC
  Filled 2023-07-05: qty 10

## 2023-07-05 MED ORDER — CHLORHEXIDINE GLUCONATE CLOTH 2 % EX PADS
6.0000 | MEDICATED_PAD | Freq: Once | CUTANEOUS | Status: DC
Start: 2023-07-05 — End: 2023-07-06

## 2023-07-05 MED ORDER — HYDROMORPHONE HCL 1 MG/ML IJ SOLN
INTRAMUSCULAR | Status: AC
  Filled 2023-07-05: qty 1

## 2023-07-05 MED ORDER — CHLORHEXIDINE GLUCONATE 0.12 % MT SOLN
15.0000 mL | Freq: Once | OROMUCOSAL | Status: AC
  Administered 2023-07-05: 15 mL via OROMUCOSAL

## 2023-07-05 MED ORDER — HYDRALAZINE HCL 20 MG/ML IJ SOLN
INTRAMUSCULAR | Status: AC
  Administered 2023-07-05: 10 mg
  Filled 2023-07-05: qty 1

## 2023-07-05 MED ORDER — BUPIVACAINE-EPINEPHRINE 0.25% -1:200000 IJ SOLN
INTRAMUSCULAR | Status: DC | PRN
  Administered 2023-07-05: 20 mL

## 2023-07-05 MED ORDER — EPHEDRINE SULFATE-NACL 50-0.9 MG/10ML-% IV SOSY
PREFILLED_SYRINGE | INTRAVENOUS | Status: DC | PRN
  Administered 2023-07-05: 5 mg via INTRAVENOUS

## 2023-07-05 MED ORDER — OXYCODONE HCL 5 MG/5ML PO SOLN: 5.0000 mg | Freq: Once | ORAL | Status: AC | PRN

## 2023-07-05 MED ORDER — MIDAZOLAM HCL 2 MG/2ML IJ SOLN
INTRAMUSCULAR | Status: AC
Start: 2023-07-05 — End: ?
  Filled 2023-07-05: qty 2

## 2023-07-05 MED ORDER — DEXMEDETOMIDINE HCL IN NACL 80 MCG/20ML IV SOLN
INTRAVENOUS | Status: DC | PRN
  Administered 2023-07-05: 12 ug via INTRAVENOUS

## 2023-07-05 MED ORDER — CELECOXIB 200 MG PO CAPS
200.0000 mg | ORAL_CAPSULE | ORAL | Status: AC
  Administered 2023-07-05: 200 mg via ORAL
  Filled 2023-07-05: qty 1

## 2023-07-05 MED ORDER — MIDAZOLAM HCL 5 MG/5ML IJ SOLN
INTRAMUSCULAR | Status: DC | PRN
  Administered 2023-07-05: 2 mg via INTRAVENOUS

## 2023-07-05 MED ORDER — BUPIVACAINE-EPINEPHRINE 0.25% -1:200000 IJ SOLN
INTRAMUSCULAR | Status: AC
  Filled 2023-07-05: qty 1

## 2023-07-05 MED ORDER — GLYCOPYRROLATE PF 0.2 MG/ML IJ SOSY
PREFILLED_SYRINGE | INTRAMUSCULAR | Status: DC | PRN
  Administered 2023-07-05: .2 mg via INTRAVENOUS

## 2023-07-05 MED ORDER — ACETAMINOPHEN 650 MG RE SUPP: 650.0000 mg | RECTAL | Status: DC | PRN

## 2023-07-05 MED ORDER — CEFAZOLIN SODIUM-DEXTROSE 2-4 GM/100ML-% IV SOLN
2.0000 g | INTRAVENOUS | Status: AC
  Administered 2023-07-05: 2 g via INTRAVENOUS
  Filled 2023-07-05: qty 100

## 2023-07-05 MED ORDER — ORAL CARE MOUTH RINSE: 15.0000 mL | Freq: Once | OROMUCOSAL | Status: AC

## 2023-07-05 MED ORDER — DROPERIDOL 2.5 MG/ML IJ SOLN: 0.6250 mg | Freq: Once | INTRAMUSCULAR | Status: DC | PRN

## 2023-07-05 MED ORDER — FENTANYL CITRATE (PF) 100 MCG/2ML IJ SOLN
INTRAMUSCULAR | Status: DC | PRN
  Administered 2023-07-05: 100 ug via INTRAVENOUS

## 2023-07-05 MED ORDER — ACETAMINOPHEN 325 MG PO TABS
650.0000 mg | ORAL_TABLET | Freq: Four times a day (QID) | ORAL | 0 refills | Status: DC
  Filled 2023-07-05: qty 50, 7d supply, fill #0

## 2023-07-05 MED ORDER — EPHEDRINE 5 MG/ML INJ
INTRAVENOUS | Status: AC
  Filled 2023-07-05: qty 5

## 2023-07-05 MED ORDER — PROPOFOL 10 MG/ML IV BOLUS
INTRAVENOUS | Status: DC | PRN
  Administered 2023-07-05: 180 mg via INTRAVENOUS

## 2023-07-05 MED ORDER — BUPIVACAINE LIPOSOME 1.3 % IJ SUSP
INTRAMUSCULAR | Status: DC | PRN
  Administered 2023-07-05: 20 mL

## 2023-07-05 MED ORDER — SODIUM CHLORIDE 0.9 % IV SOLN: 250.0000 mL | INTRAVENOUS | Status: DC | PRN

## 2023-07-05 MED ORDER — HYDROMORPHONE HCL 1 MG/ML IJ SOLN
0.2500 mg | INTRAMUSCULAR | Status: DC | PRN
Start: 2023-07-05 — End: 2023-07-06
  Administered 2023-07-05 (×2): 0.5 mg via INTRAVENOUS

## 2023-07-05 MED ORDER — ACETAMINOPHEN 500 MG PO TABS
1000.0000 mg | ORAL_TABLET | ORAL | Status: AC
  Administered 2023-07-05: 1000 mg via ORAL
  Filled 2023-07-05: qty 2

## 2023-07-05 MED ORDER — OXYCODONE HCL 5 MG PO TABS
5.0000 mg | ORAL_TABLET | Freq: Three times a day (TID) | ORAL | 0 refills | Status: DC | PRN
  Filled 2023-07-05: qty 12, 4d supply, fill #0

## 2023-07-05 MED ORDER — OXYCODONE HCL 5 MG PO TABS: 5.0000 mg | ORAL_TABLET | Freq: Three times a day (TID) | ORAL | 0 refills | Status: AC | PRN

## 2023-07-05 MED ORDER — OXYCODONE HCL 5 MG PO TABS
ORAL_TABLET | ORAL | Status: AC
  Filled 2023-07-05: qty 1

## 2023-07-05 MED ORDER — OXYCODONE HCL 5 MG PO TABS
5.0000 mg | ORAL_TABLET | ORAL | Status: DC | PRN
  Administered 2023-07-05: 5 mg via ORAL

## 2023-07-05 SURGICAL SUPPLY — 49 items
ANTIFOG SOL W/FOAM PAD STRL (MISCELLANEOUS) ×1
APPLICATOR COTTON TIP 6 STRL (MISCELLANEOUS) IMPLANT
APPLICATOR COTTON TIP 6IN STRL (MISCELLANEOUS)
BAG COUNTER SPONGE SURGICOUNT (BAG) IMPLANT
BLADE SURG SZ11 CARB STEEL (BLADE) ×1 IMPLANT
CHLORAPREP W/TINT 26 (MISCELLANEOUS) ×1 IMPLANT
COVER MAYO STAND STRL (DRAPES) ×1 IMPLANT
COVER SURGICAL LIGHT HANDLE (MISCELLANEOUS) ×1 IMPLANT
COVER TIP SHEARS 8 DVNC (MISCELLANEOUS) ×1 IMPLANT
DERMABOND ADVANCED .7 DNX12 (GAUZE/BANDAGES/DRESSINGS) ×1 IMPLANT
DRAPE ARM DVNC X/XI (DISPOSABLE) ×3 IMPLANT
DRAPE COLUMN DVNC XI (DISPOSABLE) ×1 IMPLANT
DRIVER NDL MEGA SUTCUT DVNCXI (INSTRUMENTS) ×1 IMPLANT
DRIVER NDLE MEGA SUTCUT DVNCXI (INSTRUMENTS) ×1
ELECT REM PT RETURN 15FT ADLT (MISCELLANEOUS) ×1 IMPLANT
FORCEPS BPLR 8 MD DVNC XI (FORCEP) ×1 IMPLANT
GAUZE 4X4 16PLY ~~LOC~~+RFID DBL (SPONGE) ×1 IMPLANT
GLOVE BIO SURGEON STRL SZ7 (GLOVE) ×2 IMPLANT
GOWN STRL REUS W/ TWL XL LVL3 (GOWN DISPOSABLE) ×2 IMPLANT
IRRIG SUCT STRYKERFLOW 2 WTIP (MISCELLANEOUS)
IRRIGATION SUCT STRKRFLW 2 WTP (MISCELLANEOUS) IMPLANT
KIT BASIN OR (CUSTOM PROCEDURE TRAY) ×1 IMPLANT
KIT TURNOVER KIT A (KITS) IMPLANT
MARKER SKIN DUAL TIP RULER LAB (MISCELLANEOUS) ×1 IMPLANT
MESH 3DMAX MID 5X7 LT XLRG (Mesh General) IMPLANT
NDL HYPO 22X1.5 SAFETY MO (MISCELLANEOUS) ×1 IMPLANT
NDL INSUFFLATION 14GA 120MM (NEEDLE) ×1 IMPLANT
NEEDLE HYPO 22X1.5 SAFETY MO (MISCELLANEOUS) ×1
NEEDLE INSUFFLATION 14GA 120MM (NEEDLE) ×1
OBTURATOR OPTICAL STND 8 DVNC (TROCAR) ×1
OBTURATOR OPTICALSTD 8 DVNC (TROCAR) ×1 IMPLANT
PACK CARDIOVASCULAR III (CUSTOM PROCEDURE TRAY) ×1 IMPLANT
SCISSORS MNPLR CVD DVNC XI (INSTRUMENTS) ×1 IMPLANT
SEAL UNIV 5-12 XI (MISCELLANEOUS) ×3 IMPLANT
SOL ELECTROSURG ANTI STICK (MISCELLANEOUS) ×1
SOLUTION ANTFG W/FOAM PAD STRL (MISCELLANEOUS) ×1 IMPLANT
SOLUTION ELECTROSURG ANTI STCK (MISCELLANEOUS) ×1 IMPLANT
SPIKE FLUID TRANSFER (MISCELLANEOUS) ×1 IMPLANT
SUT MNCRL AB 4-0 PS2 18 (SUTURE) ×1 IMPLANT
SUT VIC AB 2-0 SH 27X BRD (SUTURE) ×1 IMPLANT
SUT VLOC 180 2-0 9IN GS21 (SUTURE) IMPLANT
SUT VLOC 3-0 9IN GRN (SUTURE) ×1 IMPLANT
SYR 10ML LL (SYRINGE) ×1 IMPLANT
SYR 20ML LL LF (SYRINGE) ×1 IMPLANT
TAPE STRIPS DRAPE STRL (GAUZE/BANDAGES/DRESSINGS) ×1 IMPLANT
TOWEL GREEN STERILE FF (TOWEL DISPOSABLE) ×1 IMPLANT
TOWEL OR 17X26 10 PK STRL BLUE (TOWEL DISPOSABLE) ×1 IMPLANT
TROCAR Z-THREAD OPTICAL 5X100M (TROCAR) IMPLANT
TUBING INSUFFLATION 10FT LAP (TUBING) ×1 IMPLANT

## 2023-07-05 NOTE — H&P (Signed)
     Carlos Boyd Jun 13, 1981  [id number].    HPI:  42 y/o M with a left-sided inguinal hernia who presents for elective repair.  He reports that he is in his usual state of health and denies any recent changes in medications.   ROS: Review of Systems  Constitutional: Negative.   HENT: Negative.    Eyes: Negative.   Respiratory: Negative.    Cardiovascular: Negative.   Gastrointestinal: Negative.   Genitourinary: Negative.   Musculoskeletal: Negative.   Skin: Negative.   Neurological: Negative.   Endo/Heme/Allergies: Negative.   Psychiatric/Behavioral: Negative.      Family History  Problem Relation Age of Onset   Healthy Mother    Healthy Father    Diabetes Maternal Grandfather     Past Medical History:  Diagnosis Date   Left inguinal hernia 06/10/2023   Morbid obesity (HCC) 06/02/2017    Past Surgical History:  Procedure Laterality Date   ARTHROSCOPY KNEE W/ DRILLING Right    right arthroscopy knee     [place], [place]   VASECTOMY  2023   WISDOM TOOTH EXTRACTION      Social History:  reports that he has never smoked. He has never used smokeless tobacco. He reports current alcohol use of about 1.0 standard drink of alcohol per week. He reports that he does not use drugs.  Allergies: No Known Allergies  Medications Prior to Admission  Medication Sig Dispense Refill   acetaminophen (TYLENOL) 500 MG tablet Take 1,000 mg by mouth every 6 (six) hours as needed for mild pain (pain score 1-3) or moderate pain (pain score 4-6).     Cholecalciferol (VITAMIN D) 125 MCG (5000 UT) CAPS Take 5,000 Units by mouth daily.     cyanocobalamin (VITAMIN B12) 1000 MCG tablet Take 1,000 mcg by mouth daily.     ibuprofen (ADVIL) 200 MG tablet Take 800 mg by mouth every 6 (six) hours as needed for mild pain (pain score 1-3) or moderate pain (pain score 4-6).     minoxidil (ROGAINE) 2 % external solution Apply 1 application  topically daily. [facility] With  ketoconazole, caffeine and  Minoxidil     sertraline (ZOLOFT) 100 MG tablet Take 1 tablet (100 mg total) by mouth daily. 90 tablet 3   triamcinolone cream (KENALOG) 0.1 % Apply 1 Application topically 2 (two) times daily. 80 g 0    Physical Exam: Blood pressure (!) 151/103, pulse 68, temperature 98 F (36.7 C), temperature source Oral, resp. rate 18, height 5\' 10"  (1.778 m), weight 118.8 kg, SpO2 97%. Gen: NAD Abd: soft, non-distended, non-tender Groin: Left groin marked in the presence of the patient  No results found for this or any previous visit (from the past 48 hours). No results found.  Assessment/Plan 42 y/o M w/ a symptomatic left inguinal hernia  - Will proceed to the OR for repair. We discussed the alternatives and potential risks of surgery, including but not limited to: bleeding, infection, damage to bowel or surrounding structures, mesh complications, chronic pain, and recurrent hernia. All questions were addressed and consent was obtained.   - Tentative plan for discharge from PACU  T[facility] 07/05/2023, 1:24 PM Please see Amion for pager number during day hours 7:00am-4:30pm or 7:00am -11:30am on weekends

## 2023-07-05 NOTE — Anesthesia Postprocedure Evaluation (Signed)
Anesthesia Post Note  Patient: Carlos Boyd  Procedure(s) Performed: XI ROBOTIC  INGUINAL HERNIA REPAIR WITH MESH (Left)     Patient location during evaluation: PACU Anesthesia Type: General Level of consciousness: sedated and patient cooperative Pain management: pain level controlled Vital Signs Assessment: post-procedure vital signs reviewed and stable Respiratory status: spontaneous breathing Cardiovascular status: stable Anesthetic complications: no   No notable events documented.  Last Vitals:  Vitals:   07/05/23 1830 07/05/23 1901  BP: (!) 137/92 (!) 146/94  Pulse: 82 81  Resp:  16  Temp: 36.6 C 36.6 C  SpO2: 92% 96%    Last Pain:  Vitals:   07/05/23 1901  TempSrc: Oral  PainSc: 4                  Lewie Loron

## 2023-07-05 NOTE — Transfer of Care (Signed)
Immediate Anesthesia Transfer of Care Note  Patient: Carlos Boyd  Procedure(s) Performed: Procedure(s) with comments: XI ROBOTIC  INGUINAL HERNIA REPAIR WITH MESH (Left) - GEN TAP BLOCK  Patient Location: PACU  Anesthesia Type:General  Level of Consciousness:  sedated, patient cooperative and responds to stimulation  Airway & Oxygen Therapy:Patient Spontanous Breathing and Patient connected to face mask oxgen  Post-op Assessment:  Report given to PACU RN and Post -op Vital signs reviewed and stable  Post vital signs:  Reviewed and stable  Last Vitals:  Vitals:   07/05/23 1228 07/05/23 1241  BP: (!) 155/106 (!) 151/103  Pulse:    Resp:    Temp:    SpO2: 97%     Complications: No apparent anesthesia complications

## 2023-07-05 NOTE — Op Note (Signed)
Ayaan Fessler ([id number])  Operative Report   Date 07/05/2023  PREOPERATIVE DIAGNOSIS: Left inguinal hernia  POSTOPERATIVE DIAGNOSIS: Same  PROCEDURE:  Robotic Left Inguinal Hernia Repair with Mesh (Bard 3D Midweight XL Polypropylene Mesh)    SURGEON: Melody Haver, MD  ANESTHESIA: General Anesthesia    INTRAOPERATIVE FINDINGS: Large indirect left-sided inguinal hernia containing fat  IMPLANTS: Implant Name Type Inv. Item Serial No. Manufacturer Lot No. LRB No. Used Action  MESH 3DMAX MID 5X7 LT Valinda Party - [id number] Mesh General MESH 3DMAX MID 5X7 LT XLRG  [employer] BARD ACCESS [id number] Left 1 Implanted    ESTIMATED BLOOD LOSS: Minimal   COMPLICATIONS: None  SPECIMENS: None  OPERATIVE INDICATIONS: Pt is a 42 y.o. male who presents with a left inguinal hernia.  The patient desires definitive repair.  The procedure's risks, benefits, and alternatives were explained to the patient.  Risks, including the risks of bleeding, infection, need for mesh removal, and potential for hernia recurrence, were discussed.  The patient agreed to proceed and signed informed consent in front of a witness.   DESCRIPTION OF PROCEDURE: After preoperatively identifying the patient in holding, the patient was brought to the operating room and placed supine on the operating room table.  Both arms were tucked and padded to avoid potential nerve injury.  Sequential Compression Devices were placed bilaterally.  After induction of general anesthesia, the patient was given the appropriate perioperative antibiotics.  The abdomen was prepped and draped in a typical sterile fashion.  A JACHO approved time out, where the name of the patient, the operation, and the intended site were confirmed.  The procedure was begun.  I gained access to the abdomen with a veress needle at Palmer's point.  Following aspiration of air and a positive saline drop test, the insufflation tubing was connected and the abdomen brought to  .  An 8 mm port was placed in the left abdomen using an optiview technique.  A camera was introduced into the abdomen, and a thorough inspection of the abdomen was performed to confirm there was no additional pathology.  Two additional 8 mm robotic ports were placed supraumbilical and in the right abdomen.  The patient was then placed into 15-degree Trendelenburg position to facilitate examination of the bilateral inguinal spaces.  A thorough inspection of the abdomen was undertaken.  A large left indirect inguinal hernia was identified and amenable to robotic repair.       The [employer] robot was brought into the field and docked.  An 8 mm 30-degree scope was placed in the mid-abdominal port.  The peritoneum was taken down 6 cm superior to the hernia from the anterior abdominal wall, and dissection was taken inferiorly towards the hernia.  Medial to the epigastric vessels, the parietal compartment was dissected and completed to visualize the rectus muscle.  This dissection was carried down to the symphysis pubis and obturator foramen.  The retropubic space was dissected to expose at least 2 cm contralateral to the midline.  Cooper's ligament was exposed and cleared at least 2 cm below the ligament to ensure adequate space for the inferior border of the mesh.  Hesselbach's triangle was cleared, assessing for a direct hernia. There was no evidence of a direct hernia.the femoral canal was cleared of fatty contents.   Lateral to the epigastric vessels, the dissection was carried out into the visceral compartment, continuing in the true preperitoneal plane.  The indirect hernia sac was carefully reduced and separated from the cord structures  with medial retraction and a combination of blunt/sharp dissection and focused cautery.  This dissection continued until the cord structures were parietalized completely, allowing for continuous visualization of the reflected peritoneum with the line originating 2 cm below  Cooper's medially and across the psoas muscle in the lateral compartment.  The internal ring was interrogated for a cord lipoma.  Any component of cord lipoma was reduced to the retroperitoneum and seated dorsal to the preperitoneal mesh.  Having achieved a complete dissection with a critical view of the entire myopectineal orifice, a piece of Bard 3D midweight XL left-sided Polypropylene Mesh was introduced into the field.  It was centered at the iliopubic tract, with the medial side crossing the midline and the inferior edge positioned 2 cm below Cooper's ligament.  With complete coverage of the myopectineal orifice, the inferior edge of the peritoneum was posterior and inferior to the mesh.  The lateral aspect of the mesh extended 3-5 cm beyond the lateral edge of the psoas.  Cephalad retraction of the peritoneal flap did not cause lifting of the inferior mesh edge or cord structures.  The mesh was fixated using an interrupted 3-0 Vicryl suture placed to the ipsilateral Coopers ligament.  The peritoneal flap was closed with a running 3-0 Stratafix barbed suture. Hemostasis was assured in the entirety of the abdomen.  The two lateral ports were removed under direct visualization.  The abdomen was desufflated under direct visualization.  The port sites were closed, local anesthetic was infiltrated, and Dermabond was applied.  After confirming twice that the sponge, needle, and instrument counts were correct, the procedure was terminated, the patient was extubated and transferred to the recovery room in stable condition.     DISPOSITION: Stable to PACU.   Electronically Signed By: Moise Boring 07/05/2023 5:16 PM

## 2023-07-08 ENCOUNTER — Other Ambulatory Visit (HOSPITAL_COMMUNITY): Payer: Self-pay

## 2023-07-11 ENCOUNTER — Ambulatory Visit: Payer: BC Managed Care – PPO | Admitting: Family Medicine

## 2023-08-09 DIAGNOSIS — I10 Essential (primary) hypertension: Secondary | ICD-10-CM | POA: Diagnosis not present

## 2023-08-09 DIAGNOSIS — R82998 Other abnormal findings in urine: Secondary | ICD-10-CM | POA: Diagnosis not present

## 2023-08-09 DIAGNOSIS — R739 Hyperglycemia, unspecified: Secondary | ICD-10-CM | POA: Diagnosis not present

## 2023-08-13 ENCOUNTER — Encounter: Payer: BC Managed Care – PPO | Admitting: Internal Medicine

## 2023-09-10 DIAGNOSIS — I10 Essential (primary) hypertension: Secondary | ICD-10-CM | POA: Diagnosis not present

## 2023-12-17 DIAGNOSIS — F419 Anxiety disorder, unspecified: Secondary | ICD-10-CM | POA: Diagnosis not present

## 2023-12-24 DIAGNOSIS — F419 Anxiety disorder, unspecified: Secondary | ICD-10-CM | POA: Diagnosis not present

## 2023-12-31 DIAGNOSIS — F419 Anxiety disorder, unspecified: Secondary | ICD-10-CM | POA: Diagnosis not present

## 2024-01-14 DIAGNOSIS — F419 Anxiety disorder, unspecified: Secondary | ICD-10-CM | POA: Diagnosis not present

## 2024-02-04 DIAGNOSIS — F419 Anxiety disorder, unspecified: Secondary | ICD-10-CM | POA: Diagnosis not present

## 2024-02-11 DIAGNOSIS — F419 Anxiety disorder, unspecified: Secondary | ICD-10-CM | POA: Diagnosis not present

## 2024-03-03 ENCOUNTER — Other Ambulatory Visit: Payer: Self-pay | Admitting: Internal Medicine

## 2024-03-06 ENCOUNTER — Other Ambulatory Visit: Payer: Self-pay | Admitting: Medical Genetics

## 2024-03-12 ENCOUNTER — Ambulatory Visit (INDEPENDENT_AMBULATORY_CARE_PROVIDER_SITE_OTHER): Admitting: Podiatry

## 2024-03-12 ENCOUNTER — Encounter: Payer: Self-pay | Admitting: Podiatry

## 2024-03-12 VITALS — BP 133/86 | HR 62

## 2024-03-12 DIAGNOSIS — R234 Changes in skin texture: Secondary | ICD-10-CM

## 2024-03-12 NOTE — Patient Instructions (Signed)
 Look for urea 40% cream or ointment and apply to the thickened dry skin / calluses. This can be bought over the counter, at a pharmacy or online such as [employer].  You can also scrub the callus with white vinegar.  This will soften up and make it easier to file down with a pumice stone or emery board.

## 2024-03-12 NOTE — Progress Notes (Signed)
       Chief Complaint  Patient presents with   Toe Pain    L Great toe medial.  Skin cracked at least a couple months.  Has pain with closed toe shoes.  Not healing Non diabetic. No anti coag    HPI: 43 y.o. male presents today with concern for fissure along the left great toe medial nail border.  He has a dry cracked skin there for a couple months.  Does report that he has dealt with this other areas of the body periodically during the colder drier months.  His current problem makes it difficult for him to tolerate shoe gear.  Past Medical History:  Diagnosis Date   Left inguinal hernia 06/10/2023   Morbid obesity (HCC) 06/02/2017    Past Surgical History:  Procedure Laterality Date   ARTHROSCOPY KNEE W/ DRILLING Right    right arthroscopy knee     [place], [place]   VASECTOMY  2023   WISDOM TOOTH EXTRACTION      No Known Allergies  ROS denies any nausea, vomiting, fever, chills, chest pain, shortness of breath   Physical Exam: Vitals:   03/12/24 0921  BP: 133/86  Pulse: 62    General: The patient is alert and oriented x3 in no acute distress.  Dermatology: Dry cracked skin with fissuring noted to the left first toe distal medial nail.  This does not extend to the dermis.  There is some hyperkeratosis appreciated along the medial nail border.  Dry skin noted to the heels as well.  Vascular: Palpable pedal pulses bilaterally. Capillary refill within normal limits.  No appreciable edema.  No erythema or calor.  Neurological: Light touch sensation grossly intact bilateral feet.   Musculoskeletal Exam: No pedal deformities noted.  No symptomatic imitations obtained range of motion.  Muscle strength 5/5 in all major muscle groups.   Assessment/Plan of Care: 1. Fissure in skin of foot      No orders of the defined types were placed in this encounter.  None  Discussed clinical findings with patient today.  Today sharply debrided the hyperkeratotic skin of the left  first toe fissure as a courtesy using 312 scalpel blade without incident.  No underlying bleeding noted.  Instructed patient to apply 40% urea cream or ointment to the area daily.  He can also scrub the area with white vinegar and file down with a pumice or emery board.  Follow-up as needed   Kalie Cabral L. Lamount MAUL, AACFAS [facility]     [address] [place], [place] [address]                Office [phone]  Fax [phone]

## 2024-03-13 DIAGNOSIS — I10 Essential (primary) hypertension: Secondary | ICD-10-CM | POA: Diagnosis not present

## 2024-03-17 ENCOUNTER — Other Ambulatory Visit

## 2024-03-17 DIAGNOSIS — Z006 Encounter for examination for normal comparison and control in clinical research program: Secondary | ICD-10-CM

## 2024-03-17 DIAGNOSIS — F419 Anxiety disorder, unspecified: Secondary | ICD-10-CM | POA: Diagnosis not present

## 2024-03-24 DIAGNOSIS — F419 Anxiety disorder, unspecified: Secondary | ICD-10-CM | POA: Diagnosis not present

## 2024-03-27 LAB — GENECONNECT MOLECULAR SCREEN: Genetic Analysis Overall Interpretation: NEGATIVE

## 2024-04-09 DIAGNOSIS — F419 Anxiety disorder, unspecified: Secondary | ICD-10-CM | POA: Diagnosis not present

## 2024-05-06 DIAGNOSIS — R41840 Attention and concentration deficit: Secondary | ICD-10-CM | POA: Diagnosis not present

## 2024-05-13 DIAGNOSIS — F419 Anxiety disorder, unspecified: Secondary | ICD-10-CM | POA: Diagnosis not present

## 2024-05-14 DIAGNOSIS — F419 Anxiety disorder, unspecified: Secondary | ICD-10-CM | POA: Diagnosis not present

## 2024-05-14 DIAGNOSIS — R41840 Attention and concentration deficit: Secondary | ICD-10-CM | POA: Diagnosis not present

## 2024-05-28 DIAGNOSIS — Z713 Dietary counseling and surveillance: Secondary | ICD-10-CM | POA: Diagnosis not present

## 2024-06-08 DIAGNOSIS — R41840 Attention and concentration deficit: Secondary | ICD-10-CM | POA: Diagnosis not present

## 2024-06-11 DIAGNOSIS — F902 Attention-deficit hyperactivity disorder, combined type: Secondary | ICD-10-CM | POA: Diagnosis not present

## 2024-06-20 ENCOUNTER — Other Ambulatory Visit: Payer: Self-pay | Admitting: Internal Medicine

## 2024-06-22 ENCOUNTER — Other Ambulatory Visit: Payer: Self-pay

## 2024-07-02 DIAGNOSIS — Z713 Dietary counseling and surveillance: Secondary | ICD-10-CM | POA: Diagnosis not present
